# Patient Record
Sex: Male | Born: 1970 | Race: Black or African American | Hispanic: No | Marital: Single | Smoking: Current every day smoker
Health system: Southern US, Community
[De-identification: ages and names within clinical notes are randomized; demographics above are authoritative.]

## PROBLEM LIST (undated history)

## (undated) DIAGNOSIS — I10 Essential (primary) hypertension: Secondary | ICD-10-CM

## (undated) DIAGNOSIS — J449 Chronic obstructive pulmonary disease, unspecified: Secondary | ICD-10-CM

## (undated) DIAGNOSIS — J45909 Unspecified asthma, uncomplicated: Secondary | ICD-10-CM

---

## 2020-05-21 DIAGNOSIS — I10 Essential (primary) hypertension: Secondary | ICD-10-CM | POA: Diagnosis present

## 2020-05-21 DIAGNOSIS — Z720 Tobacco use: Secondary | ICD-10-CM | POA: Diagnosis present

## 2020-05-21 DIAGNOSIS — J439 Emphysema, unspecified: Secondary | ICD-10-CM | POA: Diagnosis present

## 2020-10-12 DIAGNOSIS — J302 Other seasonal allergic rhinitis: Secondary | ICD-10-CM | POA: Insufficient documentation

## 2020-12-15 ENCOUNTER — Encounter (HOSPITAL_COMMUNITY): Payer: Self-pay | Admitting: Emergency Medicine

## 2020-12-15 ENCOUNTER — Other Ambulatory Visit: Payer: Self-pay

## 2020-12-15 ENCOUNTER — Emergency Department (HOSPITAL_COMMUNITY)
Admission: EM | Admit: 2020-12-15 | Discharge: 2020-12-16 | Disposition: A | Discharge status: Home / Self Care | Payer: Self-pay | Attending: Emergency Medicine | Admitting: Emergency Medicine

## 2020-12-15 ENCOUNTER — Emergency Department (HOSPITAL_COMMUNITY): Payer: Self-pay

## 2020-12-15 DIAGNOSIS — I10 Essential (primary) hypertension: Secondary | ICD-10-CM | POA: Insufficient documentation

## 2020-12-15 DIAGNOSIS — Z79899 Other long term (current) drug therapy: Secondary | ICD-10-CM | POA: Insufficient documentation

## 2020-12-15 DIAGNOSIS — R000 Tachycardia, unspecified: Secondary | ICD-10-CM | POA: Insufficient documentation

## 2020-12-15 DIAGNOSIS — J45909 Unspecified asthma, uncomplicated: Secondary | ICD-10-CM | POA: Insufficient documentation

## 2020-12-15 DIAGNOSIS — Z20822 Contact with and (suspected) exposure to covid-19: Secondary | ICD-10-CM | POA: Insufficient documentation

## 2020-12-15 DIAGNOSIS — F17210 Nicotine dependence, cigarettes, uncomplicated: Secondary | ICD-10-CM | POA: Insufficient documentation

## 2020-12-15 DIAGNOSIS — J441 Chronic obstructive pulmonary disease with (acute) exacerbation: Secondary | ICD-10-CM | POA: Insufficient documentation

## 2020-12-15 HISTORY — DX: Chronic obstructive pulmonary disease, unspecified: J44.9

## 2020-12-15 HISTORY — DX: Unspecified asthma, uncomplicated: J45.909

## 2020-12-15 HISTORY — DX: Essential (primary) hypertension: I10

## 2020-12-15 LAB — CBC
HCT: 55.8 % — ABNORMAL HIGH (ref 39.0–52.0)
Hemoglobin: 18.4 g/dL — ABNORMAL HIGH (ref 13.0–17.0)
MCH: 29.1 pg (ref 26.0–34.0)
MCHC: 33 g/dL (ref 30.0–36.0)
MCV: 88.3 fL (ref 80.0–100.0)
Platelets: 290 10*3/uL (ref 150–400)
RBC: 6.32 MIL/uL — ABNORMAL HIGH (ref 4.22–5.81)
RDW: 13 % (ref 11.5–15.5)
WBC: 6 10*3/uL (ref 4.0–10.5)
nRBC: 0 % (ref 0.0–0.2)

## 2020-12-15 LAB — BASIC METABOLIC PANEL
Anion gap: 8 (ref 5–15)
BUN: 12 mg/dL (ref 6–20)
CO2: 27 mmol/L (ref 22–32)
Calcium: 9.5 mg/dL (ref 8.9–10.3)
Chloride: 98 mmol/L (ref 98–111)
Creatinine, Ser: 1.15 mg/dL (ref 0.61–1.24)
GFR, Estimated: >60 mL/min (ref >60)
Glucose, Bld: 113 mg/dL — ABNORMAL HIGH (ref 70–99)
Potassium: 4.9 mmol/L (ref 3.5–5.1)
Sodium: 133 mmol/L — ABNORMAL LOW (ref 135–145)

## 2020-12-15 MED ORDER — ALBUTEROL SULFATE HFA 108 (90 BASE) MCG/ACT IN AERS
2.0000 | INHALATION_SPRAY | RESPIRATORY_TRACT | Status: DC | PRN
  Administered 2020-12-16: 2 via RESPIRATORY_TRACT
  Filled 2020-12-15: qty 6.7

## 2020-12-15 NOTE — ED Triage Notes (Signed)
Pt here with c/o chest pain and shortness of breath since yesterday. Endorses hx of COPD, he has used both albuterol and Symbicort with no relief. Denies fever, n/v. No radiation of pain 'just tight'.

## 2020-12-15 NOTE — ED Provider Notes (Signed)
Emergency Medicine Provider Triage Evaluation Note  Steven [name] , a 50 y.o. male  was evaluated in triage.  Pt complains of shortness of breath and chest tightness.  Symptoms worsened yesterday.  He does have a history of COPD.  When he uses his albuterol, he does not wear significant improvement.  He has not been on prednisone for several months.  He recently moved to the area.  He denies fevers, chills, chest pain.  He denies sick contacts.  He is vaccinated for COVID and flu.  Review of Systems  Positive: Sob, chest tightness Negative: fever  Physical Exam  BP 134/90 (BP Location: Right Arm)   Pulse (!) 117   Temp 97.9 F (36.6 C)   Resp 20   Ht 5\' 10"  (1.778 m)   Wt 75.8 kg   SpO2 97%   BMI 23.96 kg/m  Gen:   Awake, no distress   Resp:  Expiratory wheezing.  Speaking in short sentences.  Sats stable on room air.  No respiratory distress MSK:   Moves extremities without difficulty Other:  Tachycardic  Medical Decision Making  Medically screening exam initiated at 3:48 PM.  Appropriate orders placed.  Steven [name] was informed that the remainder of the evaluation will be completed by another provider, this initial triage assessment does not replace that evaluation, and the importance of remaining in the ED until their evaluation is complete.  Labs, covid, and cxr ordered   Joanna Puff, PA-C 12/15/20 1549    12/17/20, MD 12/17/20 1510

## 2020-12-16 LAB — RESP PANEL BY RT-PCR (FLU A&B, COVID) ARPGX2
Influenza A by PCR: NEGATIVE
Influenza B by PCR: NEGATIVE
SARS Coronavirus 2 by RT PCR: NEGATIVE

## 2020-12-16 MED ORDER — PREDNISONE 10 MG (21) PO TBPK: ORAL_TABLET | Freq: Every day | ORAL | 0 refills | Status: DC

## 2020-12-16 MED ORDER — METHYLPREDNISOLONE SODIUM SUCC 125 MG IJ SOLR
125.0000 mg | Freq: Once | INTRAMUSCULAR | Status: AC
  Administered 2020-12-16: 125 mg via INTRAVENOUS
  Filled 2020-12-16: qty 2

## 2020-12-16 MED ORDER — IPRATROPIUM-ALBUTEROL 0.5-2.5 (3) MG/3ML IN SOLN
3.0000 mL | Freq: Once | RESPIRATORY_TRACT | Status: AC
  Administered 2020-12-16: 3 mL via RESPIRATORY_TRACT
  Filled 2020-12-16: qty 3

## 2020-12-16 MED ORDER — ALBUTEROL SULFATE (2.5 MG/3ML) 0.083% IN NEBU: 2.5000 mg | INHALATION_SOLUTION | RESPIRATORY_TRACT | 12 refills | Status: DC | PRN

## 2020-12-16 NOTE — ED Notes (Signed)
Pt tolerated ambulation well.

## 2020-12-16 NOTE — Discharge Instructions (Addendum)
Thank you for allowing me to care for you today in the Emergency Department.   Your work-up today was consistent with a COPD exacerbation.  This was treated with breathing treatments and steroids.  Take prednisone as prescribed starting tomorrow since you were given a dose of steroids in the emergency department today.  I gave you a paper prescription of this because you should receive a call from one of our social workers from the hospital to try and get you established with [facility].  When they call you, you can see if you can get this prescription filled at the [facility] pharmacy.  Use 2 puffs of your albuterol inhaler or a breathing treatment once every 4 hours as needed for wheezing or shortness of breath.  Continue your home Symbicort as prescribed.  Return to the emergency department if you develop respiratory distress, if you pass out, if your fingers or lips turn blue, if you develop severe chest pain that is constant, or other new, concerning symptoms.

## 2020-12-16 NOTE — ED Provider Notes (Signed)
[facility] EMERGENCY DEPARTMENT Provider Note   CSN: [id number] Arrival date & time: 12/15/20  1514     History Chief Complaint  Patient presents with  . Chest Pain  . Shortness of Breath    Steven [name] is a 50 y.o. male with a history of COPD, HTN, and asthma who presents to the emergency department with a chief complaint of shortness of breath.  The patient endorses constant, worsening shortness of breath, onset yesterday.  However, his symptoms significantly worsened today, which prompted his visit to the ER.  He reports an associated intermittently productive cough and chest tightness.  He states that his symptoms feel similar to previous COPD exacerbations.  He has been using his home nebulizer, albuterol inhaler, and Symbicort with no relief.  Last albuterol nebulizer treatment was yesterday.  He reports that he has been trying to quit smoking and is using nicotine gum and patches, but will occasionally still have a cigarette.  He otherwise denies chest pain, fever, chills, abdominal pain, diaphoresis, dizziness, lightheadedness, leg swelling, palpitations, headache, vomiting, diarrhea.  No other treatment prior to arrival.  The history is provided by the patient and medical records. No language interpreter was used.       Past Medical History:  Diagnosis Date  . Asthma   . COPD (chronic obstructive pulmonary disease) (HCC)   . Hypertension     There are no problems to display for this patient.   History reviewed. No pertinent surgical history.     No family history on file.  Social History   Tobacco Use  . Smoking status: Current Every Day Smoker    Packs/day: 0.20    Types: Cigarettes  . Smokeless tobacco: Never Used  Substance Use Topics  . Alcohol use: Yes  . Drug use: Not Currently    Home Medications Prior to Admission medications   Medication Sig Start Date End Date Taking? Authorizing Provider  albuterol (VENTOLIN HFA) 108  (90 Base) MCG/ACT inhaler Inhale 1-2 puffs into the lungs every 6 (six) hours as needed for wheezing or shortness of breath.   Yes [provider]  budesonide-formoterol (SYMBICORT) 160-4.5 MCG/ACT inhaler Inhale 2 puffs into the lungs 2 (two) times daily.   Yes [provider]  cetirizine (ZYRTEC) 10 MG tablet Take 10 mg by mouth daily.   Yes [provider]  lisinopril (ZESTRIL) 10 MG tablet Take 10 mg by mouth daily.   Yes [provider]  predniSONE (STERAPRED UNI-PAK 21 TAB) 10 MG (21) TBPK tablet Take by mouth daily. Take 6 tabs by mouth daily  for 2 days, then 5 tabs for 2 days, then 4 tabs for 2 days, then 3 tabs for 2 days, 2 tabs for 2 days, then 1 tab by mouth daily for 2 days 12/16/20  Yes Bode Pieper A, PA-C  albuterol (PROVENTIL) (2.5 MG/3ML) 0.083% nebulizer solution Take 3 mLs (2.5 mg total) by nebulization every 4 (four) hours as needed for wheezing or shortness of breath. 12/16/20   Sihaam Chrobak A, PA-C    Allergies    Patient has no known allergies.  Review of Systems   Review of Systems  Constitutional: Negative for appetite change, chills and fever.  HENT: Negative for congestion and sore throat.   Eyes: Negative for visual disturbance.  Respiratory: Positive for cough, chest tightness and shortness of breath. Negative for wheezing.   Cardiovascular: Negative for chest pain, palpitations and leg swelling.  Gastrointestinal: Negative for abdominal pain, constipation,  diarrhea, nausea and vomiting.  Genitourinary: Negative for dysuria.  Musculoskeletal: Negative for arthralgias, back pain, myalgias, neck pain and neck stiffness.  Skin: Negative for rash and wound.  Allergic/Immunologic: Negative for immunocompromised state.  Neurological: Negative for dizziness, seizures, syncope, weakness, numbness and headaches.  Psychiatric/Behavioral: Negative for confusion.    Physical Exam Updated Vital Signs BP 122/82   Pulse 99   Temp (!)  97.5 F (36.4 C) (Oral)   Resp 15   Ht 5\' 10"  (1.778 m)   Wt 75.8 kg   SpO2 94%   BMI 23.96 kg/m   Physical Exam Vitals and nursing note reviewed.  Constitutional:      Appearance: He is well-developed. He is not ill-appearing, toxic-appearing or diaphoretic.  HENT:     Head: Normocephalic.  Eyes:     Conjunctiva/sclera: Conjunctivae normal.  Cardiovascular:     Rate and Rhythm: Regular rhythm. Tachycardia present.     Heart sounds: No murmur heard.   Pulmonary:     Breath sounds: No stridor. Wheezing present. No rhonchi or rales.     Comments: He is tachypneic.  He has conversational dyspnea and takes a breath every 2-3 words.  Lung sounds are diminished throughout, but there are faint end expiratory wheezes with expiration.  No retractions or accessory muscle use. Chest:     Chest wall: No tenderness.  Abdominal:     General: There is no distension.     Palpations: Abdomen is soft.     Tenderness: There is no abdominal tenderness.  Musculoskeletal:     Cervical back: Neck supple.     Right lower leg: No edema.     Left lower leg: No edema.  Skin:    General: Skin is warm and dry.     Coloration: Skin is not jaundiced or pale.     Findings: No bruising, erythema, lesion or rash.  Neurological:     Mental Status: He is alert.  Psychiatric:        Behavior: Behavior normal.     ED Results / Procedures / Treatments   Labs (all labs ordered are listed, but only abnormal results are displayed) Labs Reviewed  BASIC METABOLIC PANEL - Abnormal; Notable for the following components:      Result Value   Sodium 133 (*)    Glucose, Bld 113 (*)    All other components within normal limits  CBC - Abnormal; Notable for the following components:   RBC 6.32 (*)    Hemoglobin 18.4 (*)    HCT 55.8 (*)    All other components within normal limits  RESP PANEL BY RT-PCR (FLU A&B, COVID) [id number]    EKG None  Radiology DG Chest 2 View  Result Date: 12/15/2020 CLINICAL  DATA:  Chest pain and shortness of breath. EXAM: CHEST - 2 VIEW COMPARISON:  None. FINDINGS: Lungs are hyperinflated. Marked severity emphysematous lung disease is seen with bullous changes seen throughout the bilateral upper lobes. There is no evidence of acute infiltrate. Very mild blunting of the bilateral costophrenic angles is seen. No pneumothorax is identified. The heart size and mediastinal contours are within normal limits. The visualized skeletal structures are unremarkable. IMPRESSION: 1. COPD with small bilateral pleural effusions versus pleural thickening. Electronically Signed   By: 12/17/2020 M.D.   On: 12/15/2020 16:27    Procedures Procedures   Medications Ordered in ED Medications  albuterol (VENTOLIN HFA) 108 (90 Base) MCG/ACT inhaler 2 puff (2 puffs Inhalation Given 12/16/20 0131)  methylPREDNISolone sodium succinate (SOLU-MEDROL) 125 mg/2 mL injection 125 mg (125 mg Intravenous Given 12/16/20 0132)  ipratropium-albuterol (DUONEB) 0.5-2.5 (3) MG/3ML nebulizer solution 3 mL (3 mLs Nebulization Given 12/16/20 0132)  ipratropium-albuterol (DUONEB) 0.5-2.5 (3) MG/3ML nebulizer solution 3 mL (3 mLs Nebulization Given 12/16/20 0254)    ED Course  I have reviewed the triage vital signs and the nursing notes.  Pertinent labs & imaging results that were available during my care of the patient were reviewed by me and considered in my medical decision making (see chart for details).  Clinical Course as of 12/16/20 0810  Wed Dec 16, 2020  0626 Patient recheck.  Oxygen saturation is around 93 to 94%.  He is currently receiving another DuoNeb.  Air movement has minimally increased from previous.  Wheezing is more pronounced with expiration in all lung fields.  No rhonchi or rales. [MM]    Clinical Course User Index [MM] Wilbern Pennypacker, Coral Else, PA-C   MDM Rules/Calculators/A&P                          50 year old male with a history of COPD, HTN, and asthma who presents the emergency  department with shortness of breath, intermittent productive cough, and chest tightness, onset yesterday.  He has tachypneic and tachycardic on my evaluation.  Oxygen saturations in the low 90s.  He is mildly hypertensive.  Afebrile.  On exam, he has tachypnea with conversational dyspnea and diminished lung sounds throughout with faint end expiratory wheezes.  Will give albuterol nebulizer treatment, Solu-Medrol, and reassess.  Labs and imaging of been reviewed and independently interpreted by me. CBC appears hemoconcentrated.  No leukocytosis.  No metabolic derangements.  Chest x-ray with hyperinflation of the lungs and severe emphysematous lung disease and with bullous changes throughout the bilateral upper lobes.  No acute infiltrate.  This is most suspicious for COPD with small bilateral pleural effusions versus pleural thickening.  I have a low suspicion for ACS, PE, aortic dissection, esophageal rupture, pancreatitis, cholecystitis, tension pneumothorax, acute bacterial pneumonia.  On reevaluation, patient's breathing is significantly improved after treatment.  He was ambulated through the department and did not have any hypoxia.  Wheezing has resolved.  Will discharge home with albuterol nebulizer and inhaler and prednisone Dosepak.  Will place transition of care consult since patient is not currently established with a PCP since relocating to the area.  All questions answered.  The patient is hemodynamically stable in no acute distress.  Safe for discharge home with outpatient follow-up as discussed.  Final Clinical Impression(s) / ED Diagnoses Final diagnoses:  COPD with acute exacerbation (HCC)    Rx / DC Orders ED Discharge Orders         Ordered    predniSONE (STERAPRED UNI-PAK 21 TAB) 10 MG (21) TBPK tablet  Daily        12/16/20 0623    albuterol (PROVENTIL) (2.5 MG/3ML) 0.083% nebulizer solution  Every 4 hours PRN,   Status:  Discontinued        12/16/20 0623    albuterol  (PROVENTIL) (2.5 MG/3ML) 0.083% nebulizer solution  Every 4 hours PRN        12/16/20 0625           Frederik Pear A, PA-C 12/16/20 0810    Mesner, Barbara Cower, MD 12/18/20 1450

## 2020-12-18 ENCOUNTER — Telehealth: Payer: Self-pay | Admitting: Surgery

## 2020-12-18 DIAGNOSIS — J441 Chronic obstructive pulmonary disease with (acute) exacerbation: Secondary | ICD-10-CM

## 2020-12-18 NOTE — Telephone Encounter (Signed)
ED RNCM received consult  Concerning patient needing COPD follow up. Referrals sent to [facility] they will contact patient to arrange OP follow up.  Attempted to contact patient with this update will make second attempt later.

## 2021-03-02 ENCOUNTER — Encounter (HOSPITAL_COMMUNITY): Payer: Self-pay | Admitting: Emergency Medicine

## 2021-03-02 ENCOUNTER — Inpatient Hospital Stay (HOSPITAL_COMMUNITY)
Admission: EM | Admit: 2021-03-02 | Discharge: 2021-03-03 | DRG: 192 | Disposition: A | Discharge status: Home / Self Care | Payer: Self-pay | Attending: Student in an Organized Health Care Education/Training Program | Admitting: Student in an Organized Health Care Education/Training Program

## 2021-03-02 ENCOUNTER — Other Ambulatory Visit: Payer: Self-pay

## 2021-03-02 ENCOUNTER — Emergency Department (HOSPITAL_COMMUNITY): Payer: Self-pay

## 2021-03-02 DIAGNOSIS — Z20822 Contact with and (suspected) exposure to covid-19: Secondary | ICD-10-CM | POA: Diagnosis present

## 2021-03-02 DIAGNOSIS — J439 Emphysema, unspecified: Secondary | ICD-10-CM | POA: Diagnosis present

## 2021-03-02 DIAGNOSIS — Z720 Tobacco use: Secondary | ICD-10-CM | POA: Diagnosis present

## 2021-03-02 DIAGNOSIS — F17210 Nicotine dependence, cigarettes, uncomplicated: Secondary | ICD-10-CM | POA: Diagnosis present

## 2021-03-02 DIAGNOSIS — J441 Chronic obstructive pulmonary disease with (acute) exacerbation: Principal | ICD-10-CM | POA: Diagnosis present

## 2021-03-02 DIAGNOSIS — Z79899 Other long term (current) drug therapy: Secondary | ICD-10-CM

## 2021-03-02 DIAGNOSIS — I10 Essential (primary) hypertension: Secondary | ICD-10-CM | POA: Diagnosis present

## 2021-03-02 DIAGNOSIS — R0902 Hypoxemia: Secondary | ICD-10-CM | POA: Diagnosis present

## 2021-03-02 DIAGNOSIS — Z833 Family history of diabetes mellitus: Secondary | ICD-10-CM

## 2021-03-02 DIAGNOSIS — R0603 Acute respiratory distress: Secondary | ICD-10-CM | POA: Diagnosis present

## 2021-03-02 DIAGNOSIS — Z7951 Long term (current) use of inhaled steroids: Secondary | ICD-10-CM

## 2021-03-02 LAB — CBC WITH DIFFERENTIAL/PLATELET
Abs Immature Granulocytes: 0.02 10*3/uL (ref 0.00–0.07)
Basophils Absolute: 0.1 10*3/uL (ref 0.0–0.1)
Basophils Relative: 1 %
Eosinophils Absolute: 0.3 10*3/uL (ref 0.0–0.5)
Eosinophils Relative: 4 %
HCT: 50.9 % (ref 39.0–52.0)
Hemoglobin: 16.4 g/dL (ref 13.0–17.0)
Immature Granulocytes: 0 %
Lymphocytes Relative: 31 %
Lymphs Abs: 2.1 10*3/uL (ref 0.7–4.0)
MCH: 28.4 pg (ref 26.0–34.0)
MCHC: 32.2 g/dL (ref 30.0–36.0)
MCV: 88.2 fL (ref 80.0–100.0)
Monocytes Absolute: 0.9 10*3/uL (ref 0.1–1.0)
Monocytes Relative: 14 %
Neutro Abs: 3.4 10*3/uL (ref 1.7–7.7)
Neutrophils Relative %: 50 %
Platelets: 249 10*3/uL (ref 150–400)
RBC: 5.77 MIL/uL (ref 4.22–5.81)
RDW: 16.4 % — ABNORMAL HIGH (ref 11.5–15.5)
WBC: 6.8 10*3/uL (ref 4.0–10.5)
nRBC: 0 % (ref 0.0–0.2)

## 2021-03-02 LAB — BASIC METABOLIC PANEL
Anion gap: 10 (ref 5–15)
BUN: 7 mg/dL (ref 6–20)
CO2: 24 mmol/L (ref 22–32)
Calcium: 8.8 mg/dL — ABNORMAL LOW (ref 8.9–10.3)
Chloride: 102 mmol/L (ref 98–111)
Creatinine, Ser: 0.94 mg/dL (ref 0.61–1.24)
GFR, Estimated: >60 mL/min (ref >60)
Glucose, Bld: 125 mg/dL — ABNORMAL HIGH (ref 70–99)
Potassium: 5.1 mmol/L (ref 3.5–5.1)
Sodium: 136 mmol/L (ref 135–145)

## 2021-03-02 LAB — RESPIRATORY PANEL BY PCR

## 2021-03-02 LAB — SARS CORONAVIRUS 2 (TAT 6-24 HRS): SARS Coronavirus 2: NEGATIVE

## 2021-03-02 MED ORDER — IPRATROPIUM-ALBUTEROL 0.5-2.5 (3) MG/3ML IN SOLN
3.0000 mL | Freq: Four times a day (QID) | RESPIRATORY_TRACT | Status: DC
  Administered 2021-03-02 – 2021-03-03 (×3): 3 mL via RESPIRATORY_TRACT
  Filled 2021-03-02 (×4): qty 3

## 2021-03-02 MED ORDER — FLUTICASONE FUROATE-VILANTEROL 200-25 MCG/INH IN AEPB
1.0000 | INHALATION_SPRAY | Freq: Every day | RESPIRATORY_TRACT | Status: DC
  Administered 2021-03-02 – 2021-03-03 (×2): 1 via RESPIRATORY_TRACT
  Filled 2021-03-02: qty 28

## 2021-03-02 MED ORDER — METHYLPREDNISOLONE SODIUM SUCC 40 MG IJ SOLR
40.0000 mg | INTRAMUSCULAR | Status: AC
  Administered 2021-03-02: 40 mg via INTRAVENOUS
  Filled 2021-03-02: qty 1

## 2021-03-02 MED ORDER — PREDNISONE 20 MG PO TABS
40.0000 mg | ORAL_TABLET | Freq: Every day | ORAL | Status: DC
  Administered 2021-03-03: 40 mg via ORAL
  Filled 2021-03-02: qty 2

## 2021-03-02 MED ORDER — AZITHROMYCIN 250 MG PO TABS: 250.0000 mg | ORAL_TABLET | Freq: Every day | ORAL | Status: DC

## 2021-03-02 MED ORDER — ALBUTEROL SULFATE (2.5 MG/3ML) 0.083% IN NEBU
2.5000 mg | INHALATION_SOLUTION | Freq: Four times a day (QID) | RESPIRATORY_TRACT | Status: DC | PRN
  Administered 2021-03-02: 2.5 mg via RESPIRATORY_TRACT

## 2021-03-02 MED ORDER — NICOTINE 14 MG/24HR TD PT24
14.0000 mg | MEDICATED_PATCH | TRANSDERMAL | Status: DC
  Administered 2021-03-02: 14 mg via TRANSDERMAL
  Filled 2021-03-02: qty 1

## 2021-03-02 MED ORDER — ONDANSETRON HCL 4 MG PO TABS: 4.0000 mg | ORAL_TABLET | Freq: Four times a day (QID) | ORAL | Status: DC | PRN

## 2021-03-02 MED ORDER — SODIUM CHLORIDE 0.9% FLUSH
3.0000 mL | Freq: Two times a day (BID) | INTRAVENOUS | Status: DC
  Administered 2021-03-02: 3 mL via INTRAVENOUS

## 2021-03-02 MED ORDER — ACETAMINOPHEN 325 MG PO TABS: 650.0000 mg | ORAL_TABLET | Freq: Four times a day (QID) | ORAL | Status: DC | PRN

## 2021-03-02 MED ORDER — SODIUM CHLORIDE 0.9 % IV SOLN
500.0000 mg | INTRAVENOUS | Status: AC
  Administered 2021-03-02: 500 mg via INTRAVENOUS
  Filled 2021-03-02: qty 500

## 2021-03-02 MED ORDER — ONDANSETRON HCL 4 MG/2ML IJ SOLN: 4.0000 mg | Freq: Four times a day (QID) | INTRAMUSCULAR | Status: DC | PRN

## 2021-03-02 MED ORDER — MAGNESIUM SULFATE IN D5W 1-5 GM/100ML-% IV SOLN
1.0000 g | Freq: Once | INTRAVENOUS | Status: AC
  Administered 2021-03-02: 1 g via INTRAVENOUS
  Filled 2021-03-02: qty 100

## 2021-03-02 MED ORDER — ACETAMINOPHEN 650 MG RE SUPP: 650.0000 mg | Freq: Four times a day (QID) | RECTAL | Status: DC | PRN

## 2021-03-02 MED ORDER — SENNOSIDES-DOCUSATE SODIUM 8.6-50 MG PO TABS: 1.0000 | ORAL_TABLET | Freq: Every evening | ORAL | Status: DC | PRN

## 2021-03-02 MED ORDER — ENOXAPARIN SODIUM 40 MG/0.4ML IJ SOSY
40.0000 mg | PREFILLED_SYRINGE | INTRAMUSCULAR | Status: DC
  Administered 2021-03-02: 40 mg via SUBCUTANEOUS
  Filled 2021-03-02: qty 0.4

## 2021-03-02 MED ORDER — LISINOPRIL 10 MG PO TABS
10.0000 mg | ORAL_TABLET | Freq: Every day | ORAL | Status: DC
  Administered 2021-03-03: 10 mg via ORAL
  Filled 2021-03-02: qty 1

## 2021-03-02 NOTE — Hospital Course (Addendum)
Steven [name] states he was on BiPAP for 3 hours total. He felt better wearing  it. Prior to coming in, he endorses worsening SOB, in addition to orthopnea, that significantly worsened on the 1st. He feels his continued tobacco use triggered his current exacerbation. He endorses wheezing but denies increased sputum production.   He notes a hx of chest tube placement when he was young. His lung collapsed spontaneously. It has not re-occurred.   He is willing to try Chantix.    We discussed dicharge potentially with Prednisone, Azithro, Spiriva and Chantix.  We will set him up in our clinic in 2 weeks.

## 2021-03-02 NOTE — ED Provider Notes (Signed)
She is [facility] EMERGENCY DEPARTMENT Provider Note   CSN: [id number] Arrival date & time: 03/02/21  1244     History Chief Complaint  Patient presents with   Shortness of Breath    Steven [name] is a 50 y.o. male.  HPI  50 year old male with past medical history of HTN, COPD presents emergency department with hypoxia and shortness of breath.  Admitted secondary to acuity.  Report from EMS is that the patient has been having worsening shortness of breath for the past 2 days.  On their arrival he was noted to be tachypneic and hypoxic.  He got 2 DuoNeb's and Solu-Medrol in route.  On arrival patient is tripoding, significant diminished breath sounds bilaterally with increased respiratory work.  Past Medical History:  Diagnosis Date   Asthma    COPD (chronic obstructive pulmonary disease) (HCC)    Hypertension     There are no problems to display for this patient.   History reviewed. No pertinent surgical history.     No family history on file.  Social History   Tobacco Use   Smoking status: Every Day    Packs/day: 0.20    Types: Cigarettes   Smokeless tobacco: Never  Substance Use Topics   Alcohol use: Yes   Drug use: Not Currently    Home Medications Prior to Admission medications   Medication Sig Start Date End Date Taking? Authorizing Provider  albuterol (PROVENTIL) (2.5 MG/3ML) 0.083% nebulizer solution Take 3 mLs (2.5 mg total) by nebulization every 4 (four) hours as needed for wheezing or shortness of breath. 12/16/20   McDonald, Mia A, PA-C  albuterol (VENTOLIN HFA) 108 (90 Base) MCG/ACT inhaler Inhale 1-2 puffs into the lungs every 6 (six) hours as needed for wheezing or shortness of breath.    [provider]  budesonide-formoterol (SYMBICORT) 160-4.5 MCG/ACT inhaler Inhale 2 puffs into the lungs 2 (two) times daily.    [provider]  cetirizine (ZYRTEC) 10 MG tablet Take 10 mg by mouth daily.    [provider]  lisinopril (ZESTRIL) 10 MG tablet Take 10 mg by mouth daily.    [provider]  predniSONE (STERAPRED UNI-PAK 21 TAB) 10 MG (21) TBPK tablet Take by mouth daily. Take 6 tabs by mouth daily  for 2 days, then 5 tabs for 2 days, then 4 tabs for 2 days, then 3 tabs for 2 days, 2 tabs for 2 days, then 1 tab by mouth daily for 2 days 12/16/20   McDonald, Mia A, PA-C    Allergies    Patient has no known allergies.  Review of Systems   Review of Systems  Unable to perform ROS: Acuity of condition   Physical Exam Updated Vital Signs BP (!) 146/97   Pulse 100   Temp (!) 96.6 F (35.9 C) (Axillary)   Resp (!) 37   Ht 5\' 10"  (1.778 m)   Wt 76.2 kg   SpO2 99%   BMI 24.11 kg/m   Physical Exam Vitals and nursing note reviewed.  Constitutional:      Appearance: Normal appearance.  HENT:     Head: Normocephalic.     Mouth/Throat:     Mouth: Mucous membranes are moist.  Cardiovascular:     Rate and Rhythm: Normal rate.  Pulmonary:     Effort: Tachypnea, accessory muscle usage and respiratory distress present.     Breath sounds: Decreased breath sounds and wheezing present.  Chest:     Chest  wall: No crepitus.  Abdominal:     Palpations: Abdomen is soft.     Tenderness: There is no abdominal tenderness.  Musculoskeletal:     Right lower leg: No edema.     Left lower leg: No edema.  Skin:    General: Skin is warm.  Neurological:     Mental Status: He is alert and oriented to person, place, and time. Mental status is at baseline.  Psychiatric:        Mood and Affect: Mood normal.    ED Results / Procedures / Treatments   Labs (all labs ordered are listed, but only abnormal results are displayed) Labs Reviewed  CBC WITH DIFFERENTIAL/PLATELET - Abnormal; Notable for the following components:      Result Value   RDW 16.4 (*)    All other components within normal limits  BASIC METABOLIC PANEL - Abnormal; Notable for the following components:   Glucose, Bld 125 (*)     Calcium 8.8 (*)    All other components within normal limits  SARS CORONAVIRUS 2 (TAT 6-24 HRS)    EKG EKG Interpretation  Date/Time:  Tuesday March 02 2021 12:56:03 EDT Ventricular Rate:  99 PR Interval:  144 QRS Duration: 100 QT Interval:  350 QTC Calculation: 450 R Axis:   29 Text Interpretation: Sinus rhythm Biatrial enlargement Abnormal R-wave progression, late transition NSR, similar to previous Confirmed by Coralee Pesa [phone]) on 03/02/2021 2:10:32 PM  Radiology DG Chest Port 1 View  Result Date: 03/02/2021 CLINICAL DATA:  Worsening shortness of breath over the last few days. EXAM: PORTABLE CHEST 1 VIEW COMPARISON:  12/15/2020 FINDINGS: Advanced bilateral bullous emphysema. Crowding of the lung markings at the bases. Question the possibility of hazy pneumonia in the lower lungs, in comparison to the previous exam. No evidence of pneumothorax. No pleural effusion. IMPRESSION: Severe chronic emphysema with crowding of lung markings at the lung bases. Basilar markings appear more prominent than they did in May of this year, raising the possibility of hazy pneumonia. No dense consolidation. Electronically Signed   By: Paulina Fusi M.D.   On: 03/02/2021 13:37    Procedures Procedures   Medications Ordered in ED Medications  magnesium sulfate IVPB 1 g 100 mL (1 g Intravenous New Bag/Given 03/02/21 1344)    ED Course  I have reviewed the triage vital signs and the nursing notes.  Pertinent labs & imaging results that were available during my care of the patient were reviewed by me and considered in my medical decision making (see chart for details).    MDM Rules/Calculators/A&P                           50 year old male presents the emergency department with shortness of breath.  He is dyspneic on arrival, leaning forward and tripoding, increased work of breathing, significantly diminished breath sounds bilaterally.  Transition to BiPAP with great improvement.  Chest x-ray  looks consistent with severe COPD findings, blood work is otherwise reassuring.  He has responded well to the BiPAP and looks comfortable, vitals have normalized.  Plan to wean BiPAP and admit for further evaluation and treatment.  COVID is pending.  Patients evaluation and results requires admission for further treatment and care. Patient agrees with admission plan, offers no new complaints and is stable/unchanged at time of admit.  Final Clinical Impression(s) / ED Diagnoses Final diagnoses:  None    Rx / DC Orders ED Discharge Orders  None        Rozelle Logan, DO 03/02/21 1517

## 2021-03-02 NOTE — ED Notes (Signed)
Horton, MD at bedside. RT notified to place pt on bipap.

## 2021-03-02 NOTE — H&P (Addendum)
Date: 03/02/2021               Patient Name:  Steven [name] MRN: [id number]  DOB: May 30, 1971 Age / Sex: 50 y.o., male   PCP: Pcp, No              Medical Service: Internal Medicine Teaching Service              Attending Physician: Dr. Oswaldo Done, Marquita Palms, *    First Contact: Porfirio Mylar, MS 4 Pager: [phone]  Second Contact: Dr. Mcarthur Rossetti Pager: [phone]            After Hours (After 5p/  First Contact Pager: [phone]  weekends / holidays): Second Contact Pager: [phone]   Chief Complaint: Shortness of breath  History of Present Illness:   Steven [name] is a 50 year old man with pmhx of asthma, COPD (with no previous PFTs) and hypertension here with a 1-day history of shortness of breath on exertion.   Patient notes that he has been feeling increasingly short of breath when exerting him self in the last couple of days. He denies dyspnea at rest and no chest pain. Also he notes shortness of breath when lying flat. He denies cough, sputum production, fever, chills or significant weight changes. He has not had any recent URIs. Per chart review, pt has been vaccinated for COVID and flu.  No recent changes to medications, steroids or hx of travel. He works in Production [employer], theatre/television/film and notes that working in the heat has made his breathing worse. Otherwise no other recent stressors. Pt notes that he has been using his home nebulizer and his rescue inhaler more frequently than normal with out much relief. He was being treated with BiPAP in the ED which he notes made him feel better.   Meds:  No current facility-administered medications on file prior to encounter.   Current Outpatient Medications on File Prior to Encounter  Medication Sig Dispense Refill   albuterol (PROVENTIL) (2.5 MG/3ML) 0.083% nebulizer solution Take 3 mLs (2.5 mg total) by nebulization every 4 (four) hours as needed for wheezing or shortness of breath. 75 mL 12   albuterol (VENTOLIN HFA) 108 (90 Base) MCG/ACT inhaler Inhale 1-2 puffs  into the lungs every 6 (six) hours as needed for wheezing or shortness of breath.     budesonide-formoterol (SYMBICORT) 160-4.5 MCG/ACT inhaler Inhale 2 puffs into the lungs 2 (two) times daily.     cetirizine (ZYRTEC) 10 MG tablet Take 10 mg by mouth daily.     lisinopril (ZESTRIL) 10 MG tablet Take 10 mg by mouth daily.     predniSONE (STERAPRED UNI-PAK 21 TAB) 10 MG (21) TBPK tablet Take by mouth daily. Take 6 tabs by mouth daily  for 2 days, then 5 tabs for 2 days, then 4 tabs for 2 days, then 3 tabs for 2 days, 2 tabs for 2 days, then 1 tab by mouth daily for 2 days 42 tablet 0   Allergies: Allergies as of 03/02/2021   (No Known Allergies)   Past Medical History:  Diagnosis Date   Asthma    COPD (chronic obstructive pulmonary disease) (HCC)    Hypertension     Family History: Has history of diabetes. Hx of cancer in his grandparent.   Social History: Steven [name] works in [place] and lives in [name] with his girlfriend. He does not have any pets at home. He reports a long history of smoking. Can not remember when he first started smoking. However he notes that  he smoke about 1 pack per day. No use of e-cigarettes or chewed tobacco. He drinks alcohol about 3 times a week. Unable to quantify how much he drink in on sitting. He reports no other drug use.   Review of Systems: A complete ROS was negative except as per HPI.   Physical Exam: Blood pressure (!) 143/86, pulse 79, temperature (!) 96.6 F (35.9 C), temperature source Axillary, resp. rate (!) 21, height 5\' 10"  (1.778 m), weight 76.2 kg, SpO2 98 %.  General: Well-appering man in no acute distress HEENT: No sclera icterus. Wearing BiPAP mask.  CV: Difficult to hear due to noise from BiPAP machine however S1S2 with no murmurs. Lungs: Fine crackles at base of bilateral lungs. No wheezing.  Abdomen: Non-distended, soft abdomen with out tenderness to light and deep palpation of all four quadrants.  Extremities: Bilateral  legs without swelling or erythema.  Skin: No rashes noted on visible skin.  Neuro: alert and oriented.  Psych: Appropriate affect.   CBC Latest Ref Rng & Units 03/02/2021 12/15/2020  WBC 4.0 - 10.5 K/uL 6.8 6.0  Hemoglobin 13.0 - 17.0 g/dL 12/17/2020 18.4(H)  Hematocrit 39.0 - 52.0 % 50.9 55.8(H)  Platelets 150 - 400 K/uL 249 290   CMP Latest Ref Rng & Units 03/02/2021 12/15/2020  Glucose 70 - 99 mg/dL 12/17/2020) 272(Z)  BUN 6 - 20 mg/dL 7 12  Creatinine 366(Y - 1.24 mg/dL 4.03 4.74  Sodium 2.59 - 145 mmol/L 136 133(L)  Potassium 3.5 - 5.1 mmol/L 5.1 4.9  Chloride 98 - 111 mmol/L 102 98  CO2 22 - 32 mmol/L 24 27  Calcium 8.9 - 10.3 mg/dL 563) 9.5     EKG: personally reviewed my interpretation is sinus rhythm with peaked T waves.   CXR: personally reviewed my interpretation is hazy opacities in bilateral lungs. No cardiomegaly and sharp costophrenic angles. Mild hyperinflation of lungs.   Assessment & Plan by Problem: Active Problems:   COPD exacerbation [facility])  [name] is a 50 year old male with pmhx of asthma, COPD (3-4 exacerbation in the last year), hypertension and tobacco use here with 1 day history of worsening dyspnea on exertion concerning for COPD exacerbation v pneumonia.   Dyspnea on excerption 2/2 COPD exacerbation: Pt presenting with 1-day history of DOE which has not improved with use on his home nebulizer or rescue inhaler and improves on BiPAP. No other symptoms like chest pain, cough, fever, or chills. On physical exam, afebrile, tachycardic up to 108 bpm and tachypnea. No wheezing or use of accessory muscles when breathing. CV exam unremarkable and no unilateral leg swelling. On labs, no leukocytosis. CXR showed hazy opacities bilaterally and no pneumothorax or pleural effusion. We suspect presentation is likely from a COPD exacerbation.  -start IV azithromycin 500 mg and transition to PO tomorrow  -Start methylprednisolone 40 IV and transition to prednisone taper  tomorrow  -cont albuterol, Breo and Duonebs -wean BiPAP as tolerated  -f/u respiratory panel -outpatient PFT -outpatient pulmonology follow up  HTN: Pt reports history of hypertension on home lisinopril-HCTZ 12.5 mg daily. SBP ranging from 132-168 mmHg. -start lisinopril 10 mg daily   Tobacco use: Pt smokes up to 1 pack per day.  -Start nicotine replacement patch   Diet: NPO until off BiPAP VTE: enoxaparin 40 mg daily  Code: Full  Dispo: Admit patient to Inpatient with expected length of stay greater than 2 midnights.  Signed:   Attestation for Student Documentation:  I personally was present and performed  or re-performed the history, physical exam and medical decision-making activities of this service and have verified that the service and findings are accurately documented in the student's note.  Eliezer Bottom, MD IMTS PGY-3 03/02/2021, 5:54 PM

## 2021-03-02 NOTE — ED Notes (Signed)
Pt appears to be asleep. NAD noted. 

## 2021-03-02 NOTE — ED Triage Notes (Signed)
Pt bib [facility] from home with increased shob x2days. Pt has tried home neb treatments with no results. Denies pain. Pt hasn't taken BP meds in 2 weeks due to the medication making him feel worse. EMS vitals: 210/110 BP, 26 R,102 P Pt given 2 duonebs and 125 solumedrol with EMS.

## 2021-03-02 NOTE — ED Notes (Signed)
RT at bedside.

## 2021-03-02 NOTE — ED Notes (Signed)
Assumed care at this time, chart reviewed

## 2021-03-02 NOTE — ED Notes (Signed)
Introduced myself to pt. Given ice and sprite. BSC placed at bedside per pt's request and given another blanket. Pt denies further needs.

## 2021-03-03 ENCOUNTER — Telehealth: Payer: Self-pay

## 2021-03-03 DIAGNOSIS — J441 Chronic obstructive pulmonary disease with (acute) exacerbation: Principal | ICD-10-CM

## 2021-03-03 LAB — BASIC METABOLIC PANEL
Anion gap: 12 (ref 5–15)
BUN: 9 mg/dL (ref 6–20)
CO2: 22 mmol/L (ref 22–32)
Calcium: 8.9 mg/dL (ref 8.9–10.3)
Chloride: 100 mmol/L (ref 98–111)
Creatinine, Ser: 0.91 mg/dL (ref 0.61–1.24)
GFR, Estimated: >60 mL/min (ref >60)
Glucose, Bld: 158 mg/dL — ABNORMAL HIGH (ref 70–99)
Potassium: 5.3 mmol/L — ABNORMAL HIGH (ref 3.5–5.1)
Sodium: 134 mmol/L — ABNORMAL LOW (ref 135–145)

## 2021-03-03 LAB — CBC
HCT: 45.2 % (ref 39.0–52.0)
Hemoglobin: 15 g/dL (ref 13.0–17.0)
MCH: 28.8 pg (ref 26.0–34.0)
MCHC: 33.2 g/dL (ref 30.0–36.0)
MCV: 86.8 fL (ref 80.0–100.0)
Platelets: 214 10*3/uL (ref 150–400)
RBC: 5.21 MIL/uL (ref 4.22–5.81)
RDW: 15.9 % — ABNORMAL HIGH (ref 11.5–15.5)
WBC: 7.8 10*3/uL (ref 4.0–10.5)
nRBC: 0 % (ref 0.0–0.2)

## 2021-03-03 LAB — HIV ANTIBODY (ROUTINE TESTING W REFLEX): HIV Screen 4th Generation wRfx: NONREACTIVE

## 2021-03-03 LAB — TSH: TSH: 0.13 u[IU]/mL — ABNORMAL LOW (ref 0.350–4.500)

## 2021-03-03 MED ORDER — PREDNISONE 20 MG PO TABS: 40.0000 mg | ORAL_TABLET | Freq: Every day | ORAL | 0 refills | Status: AC

## 2021-03-03 MED ORDER — TIOTROPIUM BROMIDE MONOHYDRATE 18 MCG IN CAPS: 18.0000 ug | ORAL_CAPSULE | Freq: Every day | RESPIRATORY_TRACT | 0 refills | Status: DC

## 2021-03-03 MED ORDER — VARENICLINE TARTRATE 0.5 MG X 11 & 1 MG X 42 PO MISC: ORAL | 0 refills | Status: DC

## 2021-03-03 MED ORDER — AZITHROMYCIN 250 MG PO TABS: 250.0000 mg | ORAL_TABLET | Freq: Every day | ORAL | 0 refills | Status: AC

## 2021-03-03 NOTE — Telephone Encounter (Signed)
Medical student called on behalf of Dr Tish Men to get a Hospital F/U for pt being discharged today for 2 week f/u  was given 8/17@9 :50 with Dr. Sloan Leiter

## 2021-03-03 NOTE — ED Notes (Signed)
Pt ambulated on room air, O2 sat fell as low as 92% but recovered without intervention to 99% during ambulation. Inpatient team updated. VSS, no distress noted.

## 2021-03-03 NOTE — ED Notes (Signed)
Pt has been asleep. Asking for ice/more to drink. Pt given both. Denies further needs.

## 2021-03-03 NOTE — ED Notes (Signed)
RN emptied urinal, titrate to RA, recheck VS, morning meds. Pt denies needs, pain. No distress noted.

## 2021-03-03 NOTE — ED Notes (Signed)
Pt verbalized understanding of d/c instructions, meds and followup care. Denies questions. VSS, no distress noted. Steady gait to exit with spouse. 

## 2021-03-03 NOTE — Discharge Instructions (Signed)
Mr. Steven, [name] were recently hospitalized at [facility] for a flare up of your COPD. We treated you with antibiotics, steroids and inhalers. You responded well to these medications so we think you are safe to continue your treatment from home. We are discharging you with 4 extra day dose of prednisone and azithromycin. Please make sure to complete this course as it will help with quick recovery.   We are also sending you home with a new inhaler called Spiriva. Please use this medication as instructed, it will help with reducing the number of emergency room visits. Lastly, as we discussed, we are sending you home with Chantix to help you stop smoking. Some side effects to look out for with this medication include: abdominal upset, nausea, vomiting and abnormal dreams. If you notice that you are having these symptoms, please let your doctor know at your next appointment.   We also have you scheduled for a follow up visit on the 17th of August at 9:45am. Please make sure to attend this appointment as we can ensure you get the lung doctor follow up you need.   It was a pleasure taking care of you. Please let us know if you have and concerns or questions. We wish you a speedy recovery and we will see you in clinic.

## 2021-03-04 LAB — T3, FREE: T3, Free: 2.6 pg/mL (ref 2.0–4.4)

## 2021-03-15 NOTE — Telephone Encounter (Signed)
Transition Care Management Follow-up Telephone Call Date of discharge and from where: Pt was discharged from the ED on 8/3. How have you been since you were released from the hospital? Pt stated he has been "ok". Any questions or concerns? Stated he was unable to get certain medications b/c they were too expensive; stated the inhalers were $500. Stated he was given a sample of one of the inhalers (he thinks Symbicort) but currently out. Also unable to afford Chantix. Stated he will talk the doctor at his appointment.  Items Reviewed: Did the pt receive and understand the discharge instructions provided? Yes   Any new allergies since your discharge? No  Dietary orders reviewed?  N/A. Do you have support at home? Yes   Home Care and Equipment/Supplies: Were home health services ordered? No.  Stated he already had a nebulizer.    Functional Questionnaire: (I = Independent and D = Dependent) ADLs: I  Bathing/Dressing- I  Meal Prep- I  Eating- I  Maintaining continence- I  Transferring/Ambulation- I  Managing Meds- I  Follow up appointments reviewed:  PCP Hospital f/u appt confirmed? Yes  Scheduled to see Dr Sloan Leiter on 03/17/21 @ 0945 AM. Specialist Hospital f/u appt confirmed?  N/A. Are transportation arrangements needed? No  If their condition worsens, is the pt aware to call PCP or go to the Emergency Dept.? Yes Was the patient provided with contact information for the PCP's office or ED? Yes Was to pt encouraged to call back with questions or concerns? Yes

## 2021-03-16 NOTE — Assessment & Plan Note (Deleted)
Patients presents with tobacco use since age 50, around 1 PPD.  In recent hospital admission, patient was encouraged to decrease amount of smoking and work towards quitting.  At discharge, he was prescribed Chantix and nicotine patches.

## 2021-03-16 NOTE — Assessment & Plan Note (Deleted)
Patient presents with recent history of admission due to presumed COPD exacerbation.  History of possible asthma and spontaneous pneumothorax at 19, but no prior pulmonary function testing.  Patient was placed on BiPAP in ED with improvement in shortness of breath. Patient received At discharge 03/03/21, patient was on Prednisone 40 mg daily for 5 days, Azithromycin 250 mg for 4 days, continuation of outpatient Symbicort and Spiriva was added due to frequency of exacerbations.   Plan: -Continue Albuterol, Symbicort, and Spiriva.  Will send refills for inhalers to outpatient pharmacy under IM program -Consider testing for alpha1 antitrypsin deficiency, PFT or pulmonary referral. -Patient is currently uninsured,

## 2021-03-17 ENCOUNTER — Encounter: Payer: Self-pay | Admitting: Internal Medicine

## 2021-03-17 DIAGNOSIS — J439 Emphysema, unspecified: Secondary | ICD-10-CM

## 2021-03-17 DIAGNOSIS — Z720 Tobacco use: Secondary | ICD-10-CM

## 2021-03-19 ENCOUNTER — Encounter: Payer: Self-pay | Admitting: Internal Medicine

## 2021-03-22 ENCOUNTER — Encounter: Payer: Self-pay | Admitting: Internal Medicine

## 2021-03-22 NOTE — Progress Notes (Deleted)
COPD- recent exacerbation Albuterol, symbicort and Spiriva new inhalers-->note from Transition of care that inhalers were to expensive    [facility] outpt pharm: Proventil, Symbicort, Spiriva Still smoking-smoked since 19 one ppd, d/c w chantix vs wellbutrin Pulm referral for PFT?  Consider alpha-1 antitrypsin w/u HTN LABs: TSH and T4

## 2021-03-23 ENCOUNTER — Other Ambulatory Visit: Payer: Self-pay

## 2021-03-23 ENCOUNTER — Other Ambulatory Visit (HOSPITAL_COMMUNITY): Payer: Self-pay

## 2021-03-23 ENCOUNTER — Encounter: Payer: Self-pay | Admitting: Internal Medicine

## 2021-03-23 ENCOUNTER — Ambulatory Visit (INDEPENDENT_AMBULATORY_CARE_PROVIDER_SITE_OTHER): Payer: Self-pay | Admitting: Internal Medicine

## 2021-03-23 VITALS — BP 156/103 | HR 80 | Temp 98.2°F | Resp 36 | Ht 70.0 in | Wt 167.8 lb

## 2021-03-23 DIAGNOSIS — I10 Essential (primary) hypertension: Secondary | ICD-10-CM

## 2021-03-23 DIAGNOSIS — J439 Emphysema, unspecified: Secondary | ICD-10-CM

## 2021-03-23 DIAGNOSIS — Z0000 Encounter for general adult medical examination without abnormal findings: Secondary | ICD-10-CM | POA: Insufficient documentation

## 2021-03-23 DIAGNOSIS — Z720 Tobacco use: Secondary | ICD-10-CM

## 2021-03-23 MED ORDER — VARENICLINE TARTRATE 1 MG PO TABS
1.0000 mg | ORAL_TABLET | Freq: Two times a day (BID) | ORAL | 0 refills | Status: AC
  Filled 2021-03-23: qty 42, 21d supply, fill #0

## 2021-03-23 MED ORDER — VARENICLINE TARTRATE 0.5 MG PO TABS
ORAL_TABLET | ORAL | 0 refills | Status: AC
  Filled 2021-03-23: qty 11, 7d supply, fill #0

## 2021-03-23 MED ORDER — DULERA 200-5 MCG/ACT IN AERO
2.0000 | INHALATION_SPRAY | Freq: Every day | RESPIRATORY_TRACT | 2 refills | Status: DC
  Filled 2021-03-23: qty 13, 30d supply, fill #0
  Filled 2021-05-17: qty 13, 30d supply, fill #1
  Filled 2021-06-21: qty 13, 30d supply, fill #2

## 2021-03-23 MED ORDER — HYDROCHLOROTHIAZIDE 12.5 MG PO TABS
12.5000 mg | ORAL_TABLET | Freq: Every day | ORAL | 2 refills | Status: DC
  Filled 2021-03-23: qty 30, 30d supply, fill #0
  Filled 2021-04-15: qty 30, 30d supply, fill #1

## 2021-03-23 MED ORDER — VARENICLINE TARTRATE 0.5 MG X 11 & 1 MG X 42 PO MISC
ORAL | 0 refills | Status: DC
  Filled 2021-03-23: qty 1, fill #0

## 2021-03-23 MED ORDER — ALBUTEROL SULFATE HFA 108 (90 BASE) MCG/ACT IN AERS
1.0000 | INHALATION_SPRAY | Freq: Four times a day (QID) | RESPIRATORY_TRACT | 2 refills | Status: DC | PRN
  Filled 2021-03-23: qty 8.5, 25d supply, fill #0
  Filled 2021-04-15: qty 8.5, 25d supply, fill #1
  Filled 2021-05-17: qty 8.5, 25d supply, fill #2

## 2021-03-23 MED ORDER — SPIRIVA HANDIHALER 18 MCG IN CAPS
18.0000 ug | ORAL_CAPSULE | Freq: Every day | RESPIRATORY_TRACT | 2 refills | Status: DC
  Filled 2021-03-23: qty 30, 30d supply, fill #0
  Filled 2021-04-15: qty 30, 30d supply, fill #1
  Filled 2021-05-17: qty 30, 30d supply, fill #2

## 2021-03-23 NOTE — Progress Notes (Addendum)
   CC: I had a COPD flare  HPI:  [name] is a 50 y.o. with a history of COPD, tobacco use disorder, and HTN who presents after an ED visit for a COPD exacerbation. He is accompanied by his fiance, [name]. He was admitted on 8/2 after presenting to [facility] ED in respiratory distress. He was treated with a course of prednisone and azithromycin, and discharged with a new medication, Spiriva, in addition to his typical regimen of Symbicort BID and Albuterol PRN. He was also started on Chantix. He states that he is significantly improved from his COPD flare and is not experiencing any shortness of breath, but he has been unable to obtain his new medications due to cost.   He has had 3 COPD exacerbations requiring ED visits this year (March, May, August). He states that his flares are triggered by smoking, seasonal allergies, and strong odors. His prior PCP was Dr. Eustace Quail at [facility] in [place]. Mr. Sheffler moved to [place] 5 months ago and would like to establish care here.   Please see problem-based charting for assessment and plan.   Medical History:  HTN  COPD  Pneumothorax at 50 y.o.  Current Medications:  Symbicort 160-4.9mcg/act inhaler 2 puffs BID Albuterol 126mcg/act inhaler 1-2 PRN shortness of breathe Cetirizine 20mg  daily  Spiriva daily   Social History:  25 pack/year smoking history. Lives in [place] with his fiance, [name]. Works as a [name], air conditioners, etc.    Review of Systems:   Review of Systems  Constitutional:  Negative for fever.  HENT: Negative.    Eyes: Negative.   Respiratory:  Positive for cough. Negative for sputum production and shortness of breath.   Cardiovascular:  Negative for chest pain and leg swelling.  Gastrointestinal: Negative.   Genitourinary: Negative.   Neurological: Negative.    Physical Exam:  Vitals:   03/23/21 1023 03/23/21 1029  BP: (!) 146/102 (!) 156/103   Pulse: 84 80  Resp: (!) 36   Temp: 98.2 F (36.8 C)   TempSrc: Oral   SpO2: 100%   Weight: 167 lb 12.8 oz (76.1 kg)   Height: 5\' 10"  (1.778 m)    Physical Exam Constitutional:      Appearance: Normal appearance.     Interventions: He is intubated.  Cardiovascular:     Rate and Rhythm: Normal rate and regular rhythm.  Pulmonary:     Effort: No respiratory distress. He is intubated.     Breath sounds: Decreased air movement present. Decreased breath sounds present. No wheezing.  Abdominal:     General: Abdomen is flat.     Palpations: Abdomen is soft.  Musculoskeletal:        General: Normal range of motion.  Skin:    General: Skin is warm and dry.  Neurological:     Mental Status: He is alert.     Assessment & Plan:   Please see Encounters Tab for problem based charting.  Patient discussed with Dr. 03/25/21 for Student Documentation:  I personally was present and performed or re-performed the history, physical exam and medical decision-making activities of this service and have verified that the service and findings are accurately documented in the student's note.  , MD 03/23/2021, 3:02 PM

## 2021-03-23 NOTE — Assessment & Plan Note (Addendum)
HPI: Patient's BP today is elevated at 156/103 with a goal of <140/80. The patient's HTN was previously well-controlled with Lisinopril, which he took for several months without any side effects. However, he noticed recently he no longer woke up with morning erections. He also had ED problems. He stopped taking lisinopril and after a few weeks he noticed his problems with ED resolved. We will not work up further today, but we discussed it is possibly a sign of vascular disease and discussed smoking cessation. We will start HCTZ today to help with blood pressure. We considered doxazosin, since it has a benefit of improving sexual function while also treating HTN, but there is evidence from the ALLHAT study that doxazosin increases risk of heart failure.   Assessment/Plan: Essential hypertension - Start Hydrochlorothiazide (HYDRODIURIL) 12.5 MG tablet; Take 1 tablet (12.5 mg total) by mouth daily.  Dispense: 30 tablet; Refill: 2 - Follow up in 4 week, will consider adding amlodipine at this time if BP elevated.

## 2021-03-23 NOTE — Patient Instructions (Signed)
Thank you, [name] for allowing [place] to provide your care today. Today we discussed your COPD, your smoking, and your blood pressure .    COPD - We have resent your Albuterol and your Spiriva to the [facility], where you can get these for $4. We have switched the Symbicort inhaler to the [facility] inhaler, and it will also be $4.  Smoking - We sent the Chantix to the [facility], where it will be $4.  Blood Pressure - Your blood pressure was high today. We switched your Lisinopril to HCTZ. Let us know if you have any side effects from the HCTZ.   I have ordered the following medication/changed the following medications:   Stop the following medications: Medications Discontinued During This Encounter  Medication Reason   budesonide-formoterol (SYMBICORT) 160-4.5 MCG/ACT inhaler Change in therapy     Start the following medications: Meds ordered this encounter  Medications   varenicline (CHANTIX PAK) 0.5 MG X 11 & 1 MG X 42 tablet    Sig: Take one 0.5 mg tablet by mouth once daily for 3 days, then increase to one 0.5 mg tablet twice daily for 4 days, then increase to one 1 mg tablet twice daily.    Dispense:  1 each    Refill:  0    IM program   albuterol (PROVENTIL HFA) 108 (90 Base) MCG/ACT inhaler    Sig: Inhale 1-2 puffs into the lungs every 6 (six) hours as needed for wheezing or shortness of breath.    Dispense:  18 g    Refill:  2    IM program   tiotropium (SPIRIVA HANDIHALER) 18 MCG inhalation capsule    Sig: Place 1 capsule (18 mcg total) into inhaler and inhale daily at 6 (six) AM.    Dispense:  30 capsule    Refill:  2    IM program   mometasone-formoterol (DULERA) 200-5 MCG/ACT AERO    Sig: Inhale 2 puffs into the lungs daily.    Dispense:  1 each    Refill:  2    IM program   hydrochlorothiazide (HYDRODIURIL) 12.5 MG tablet    Sig: Take 1 tablet (12.5 mg total) by mouth daily.    Dispense:  30 tablet    Refill:  2    IM  program      Follow up:  1 month     Remember: If you notice your COPD is starting to worsen, come see [place] before it gets bad enough for the Emergency Room.  Should you have any questions or concerns please call the internal medicine clinic at [phone].     Dayna Barker, Medical Student Thurmon Fair, M.D. [facility]

## 2021-03-23 NOTE — Assessment & Plan Note (Signed)
Patient has never had a coloscopy. He does not have a family history of colon cancer. We will revisit this topic at his next visit.   Plan:  Follow-up in 4 weeks

## 2021-03-23 NOTE — Assessment & Plan Note (Addendum)
Patient was diagnosed with severe chronic bullous emphysema in October 2021 at [facility]. He has a 33 pack/year smoking history. He typically has 1-2 COPD exacerbations requiring an ED visit per year, although this year he has had 3 (March, May, August). His flares are triggered by smoking, seasonal allergies, and strong odors. He has never had PFTs or seen a pulmonologist. He notes occasional peripheral edema that resolves when he elevates his feet. He has never had an echo. He was recently started on Spiriva but has been unable to obtain it because he is uninsured and was told it is $500. We will resend his medications to [facility] as part of the IM Program as all of them except the Symbicort are on the $4 list. We will replace Symbicort with an equivalent, Dulera. We discussed the importance of smoking cessation (see tobacco use problem) to prevent worsening of his disease. We also discussed that he should come here to be seen when he begins to notice a flare up occuring, as opposed to waiting until the severity warrants an ED visit.   Plan:   -Albuterol, Elwin Sleight, Spiriva sent to [facility] as part of IM Program  -Continue OTC Cetirizine 20mg  daily  -Follow-up in 4 weeks

## 2021-03-23 NOTE — Assessment & Plan Note (Signed)
Patient has a 33 pack/yr smoking history. He is motivated to quit smoking and he recognizes that smoking directly worsens his COPD. He has tried nicotine gum in the past but it did not help. He was prescribed Chantix by the ED at his most recent visit on 8/2 but was unable to obtain it due to cost. We will re-send Chantix using our IM Program discount as it is covered on the $4 list. We will follow-up in 4 weeks to see how he is tolerating Chantix.   Plan:  -Start Chantix  -Follow-up in 4 weeks

## 2021-03-30 NOTE — Progress Notes (Signed)
Internal Medicine Clinic Attending  Case discussed with Dr. Steen  At the time of the visit.  We reviewed the resident's history and exam and pertinent patient test results.  I agree with the assessment, diagnosis, and plan of care documented in the resident's note.  

## 2021-04-06 ENCOUNTER — Telehealth: Payer: Self-pay | Admitting: Internal Medicine

## 2021-04-06 ENCOUNTER — Ambulatory Visit (INDEPENDENT_AMBULATORY_CARE_PROVIDER_SITE_OTHER): Payer: Self-pay | Admitting: Internal Medicine

## 2021-04-06 ENCOUNTER — Other Ambulatory Visit (HOSPITAL_COMMUNITY): Payer: Self-pay

## 2021-04-06 DIAGNOSIS — J302 Other seasonal allergic rhinitis: Secondary | ICD-10-CM

## 2021-04-06 MED ORDER — FLUTICASONE PROPIONATE 50 MCG/ACT NA SUSP
1.0000 | Freq: Every day | NASAL | 2 refills | Status: DC
  Filled 2021-04-07: qty 16, 60d supply, fill #0
  Filled 2022-01-27: qty 16, 60d supply, fill #1

## 2021-04-06 MED ORDER — CETIRIZINE HCL 10 MG PO TABS: 10.0000 mg | ORAL_TABLET | Freq: Every day | ORAL | 0 refills | Status: DC

## 2021-04-06 NOTE — Telephone Encounter (Signed)
Ok

## 2021-04-06 NOTE — Telephone Encounter (Signed)
Called pt - stated he prefers telehealth appt at this time.

## 2021-04-06 NOTE — Progress Notes (Signed)
  [facility] Internal Medicine Residency Telephone Encounter Continuity Care Appointment  HPI:  This telephone encounter was created for [name] on 04/06/2021 for the evaluation of nasal congestion. Please refer to problem based charting for assessment and plan.   Past Medical History:  Past Medical History:  Diagnosis Date   Asthma    COPD (chronic obstructive pulmonary disease) (HCC)    Hypertension       Assessment / Plan / Recommendations:  Problem List Items Addressed This Visit       Respiratory   Seasonal allergic rhinitis    He requested a telehealth visit today to discuss respiratory symptoms that started 9/4.  He is experienced some mild shortness of breath however on further discussion with him, primary complaint is sinus congestion.  He notes that this is affected his breathing but denies increased use of albuterol or a productive cough.  He denies fever, chills, chest pain. He notes that he has been out of Zyrtec. He also notes that he has been tobacco free for about a week but has noticed that the above symptoms correlated with when he increased the Chantix dose that he recently started.  Assessment: His overall symptoms are consistent with sinus congestion.  This is most likely due to allergic rhinitis and not taking Zyrtec however cannot rule out an infectious cause.  He reports having received appropriate COVID-19 vaccinations.  In the absence of other systemic symptoms of infection I think this is less likely.  In regards to a COPD exacerbation, based on our discussion today, I would consider this fairly low on the differential however I explained to them that if symptoms worsen, he should call us back and arrange in person visit.  Plan Resume Zyrtec (refilled and sent to pharmacy) Start Flonase Follow-up with Dr. Barbaraann Faster as arranged for next week Return precautions discussed       As always, pt is advised that if symptoms worsen or new symptoms arise,  they should go to an urgent care facility or to to ER for further evaluation.   Consent and Medical Decision Making:  Patient discussed with Dr.  Mayford Knife This is a telephone encounter between Steven [name] and Nancy Arvin on 04/06/2021 for sinus congestion. The visit was conducted with the patient located at home and [employer] at [facility]. The patient's identity was confirmed using their DOB and current address. The patient has consented to being evaluated through a telephone encounter and understands the associated risks (an examination cannot be done and the patient may need to come in for an appointment) / benefits (allows the patient to remain at home, decreasing exposure to coronavirus). I personally spent 25 minutes on medical discussion.    Elige Radon, MD Internal Medicine Resident PGY-3 [facility] Pager: [phone] 04/06/2021 6:08 PM

## 2021-04-06 NOTE — Telephone Encounter (Signed)
Please call back to [facility]) to the patient as the patient has been having breathing problems over the weekend and she would like to know what to do.

## 2021-04-06 NOTE — Telephone Encounter (Signed)
I would prefer that he be seen in the office. Is he willing to come in?

## 2021-04-06 NOTE — Assessment & Plan Note (Signed)
He requested a telehealth visit today to discuss respiratory symptoms that started 9/4.  He is experienced some mild shortness of breath however on further discussion with him, primary complaint is sinus congestion.  He notes that this is affected his breathing but denies increased use of albuterol or a productive cough.  He denies fever, chills, chest pain. He notes that he has been out of Zyrtec. He also notes that he has been tobacco free for about a week but has noticed that the above symptoms correlated with when he increased the Chantix dose that he recently started.  Assessment: His overall symptoms are consistent with sinus congestion.  This is most likely due to allergic rhinitis and not taking Zyrtec however cannot rule out an infectious cause.  He reports having received appropriate COVID-19 vaccinations.  In the absence of other systemic symptoms of infection I think this is less likely.  In regards to a COPD exacerbation, based on our discussion today, I would consider this fairly low on the differential however I explained to them that if symptoms worsen, he should call us back and arrange in person visit.  Plan  Resume Zyrtec (refilled and sent to pharmacy)  Start Flonase  Follow-up with Dr. Barbaraann Faster as arranged for next week  Return precautions discussed

## 2021-04-06 NOTE — Telephone Encounter (Signed)
Return call to pt's significant other, [name], who stated pt is having difficulty breathing as before; last seen 8/23. And was told to call the office first instead of going to the ED and the doctor will order medication. Stated Prednisone helped before. Stated symptoms started Friday - coughing, wheezing and sob. Telehealth appt given today @ 1515 PM with Dr Ephriam Knuckles. She stated to call the pt on his phone [phone].

## 2021-04-07 ENCOUNTER — Other Ambulatory Visit (HOSPITAL_COMMUNITY): Payer: Self-pay

## 2021-04-08 ENCOUNTER — Ambulatory Visit (INDEPENDENT_AMBULATORY_CARE_PROVIDER_SITE_OTHER): Payer: Self-pay | Admitting: Internal Medicine

## 2021-04-08 ENCOUNTER — Encounter: Payer: Self-pay | Admitting: Internal Medicine

## 2021-04-08 ENCOUNTER — Other Ambulatory Visit: Payer: Self-pay

## 2021-04-08 ENCOUNTER — Other Ambulatory Visit (HOSPITAL_COMMUNITY): Payer: Self-pay

## 2021-04-08 VITALS — BP 152/105 | HR 100 | Temp 97.8°F | Ht 70.0 in | Wt 163.0 lb

## 2021-04-08 DIAGNOSIS — J441 Chronic obstructive pulmonary disease with (acute) exacerbation: Secondary | ICD-10-CM

## 2021-04-08 DIAGNOSIS — R32 Unspecified urinary incontinence: Secondary | ICD-10-CM

## 2021-04-08 MED ORDER — METHYLPREDNISOLONE SODIUM SUCC 125 MG IJ SOLR
60.0000 mg | Freq: Once | INTRAMUSCULAR | Status: AC
  Administered 2021-04-08: 60 mg via INTRAMUSCULAR

## 2021-04-08 MED ORDER — PREDNISONE 20 MG PO TABS
ORAL_TABLET | ORAL | 0 refills | Status: DC
  Filled 2021-04-08: qty 23, 10d supply, fill #0

## 2021-04-08 NOTE — Progress Notes (Signed)
Office Visit   Patient ID: Steven [name], male    DOB: 1971-05-05, 50 y.o.   MRN: [id number]   PCP: Adron Bene, MD   Subjective:  Steven [name] is a 50 y.o. year old male with severe COPD who presents for follow up from his telehealth visit with me yesterday. Please refer to problem based charting for assessment and plan.    Objective:   BP (!) 152/105 (BP Location: Right Arm, Patient Position: Sitting, Cuff Size: Normal)   Pulse 100   Temp 97.8 F (36.6 C) (Oral)   Ht 5\' 10"  (1.778 m)   Wt 163 lb (73.9 kg)   SpO2 97%   BMI 23.39 kg/m  BP Readings from Last 3 Encounters:  04/08/21 (!) 152/105  03/23/21 (!) 156/103  03/03/21 120/75   General: thin male in no acute while resting; sitting in wheelchair Cardiac: tachycardic rate, regular rhythm Pulm: breathing appears fairly comfortable on room air while seating. No tripoding or pursed lip breathing. Not tachypnic. Coughs intermittently, producing clear sputum. Severely diminished lung sounds throughout all lung fields on auscultation. Assessment & Plan:   Problem List Items Addressed This Visit       Respiratory   COPD exacerbation (HCC)    He was recently admitted on 8/2 for a COPD exacerbation, initially requiring BiPAP. He received solumedrol and nebulizers and symptoms quickly improved. Later that day, he was ambulating on room air while maintaining O2 saturations so he was subsequently discharged with the addition of spiriva and a 5d course of 40mg  prednisone.   Of note, he has been to the ED 5 times in the past 12 months for COPD exacerbations.   HPI: He presents to the office today for follow up of the telehealth visit that he had with me yesterday. He is a fairly poor historian, down playing symptoms. His significant other was present at today's visit and contributes to the history.  Severe worsening of symptoms prompted today's evalation. His significant other noticed that he began having troubles ambulating to  the bathroom due to the shortness of breath this morning and became incontinent of urine. She reports that he had no problem ambulating out to the car yesterday. She feels that his symptoms never fully recovered after hospital discharge.  Exam: breathing appears fairly comfortable on room air while seating. No tripoding or pursed lip breathing. Not tachypnic. Coughs intermittently, producing clear sputum. Severely diminished lung sounds throughout all lung fields on auscultation. Ambulatory oxygen saturations dropped to 86%. He developed severe shortness of breath with this requiring a wheelchair to return to the room. During this episode he developed urinary incontinence which he says happened earlier today and during his first COPD exacerbation due to panic from the shortness of breath. He is otherwise continent and does not experience similar symptoms.   Assessment: Exacerbation of Severe bullous emphysematous COPD. Disease management is complicated by financial constraints and uninsured status. He is high risk for further decompensation. I recommended hospital admission. He declined, stating that he would be ok. I explained the risk for progression of respiratory failure that could result in death however he continued to decline admission. I discussed the risk for pneumothorax in the setting of severe bullous emphysema.  I suspect that he requires a longer steroid taper due to the severity of his disease process. He does not have additional symptoms that suggest an infectious cause, so will defer antibiotic treatment for now.  His inhaler regimen is already maximized within the confines of  financial constraints and primary care management. I discussed that he ultimately needs to see pulmonology and acknowledged the lack of insurance being a prohibiting factor. Kayde's significant other is working on completing the orange card paperwork. I explained that this does not mean that he will automatically get  to see pulmonology as the offices have set numbers of orange card patients that they accept. I recommended that they expedite the completion of the orange card application and consider alternative options in preparation for continued disease progression.  Plan 60mg  solumedrol IM provided in the clinic today Start prednisone taper tomorrow: 60mg  daily x5 days, followed by 40mg  daily x3 days, followed by 20mg  daily x2 days. 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  Ambulatory oxygen saturations dropped to 86%. He developed severe shortness of breath with this requiring a wheelchair to return to the room. During this episode he developed urinary incontinence which he says happened earlier today and during his first COPD exacerbation due to panic from the shortness of breath. He is otherwise continent and does not experience similar symptoms.  I do not think the incontinence requires further investigation at this time. Should he develop more frequent incidents or he develops symptoms of BPH, further workup can be considered.        Return in about 4 days (around 04/12/2021).   Pt discussed with Dr. , MD Internal Medicine Resident PGY-3 Internal Medicine Residency 04/08/2021 6:05 PM

## 2021-04-08 NOTE — Patient Instructions (Signed)
If you develop worsening symptoms, please seek further evaluation in the emergency department.

## 2021-04-08 NOTE — Assessment & Plan Note (Addendum)
He was recently admitted on 8/2 for a COPD exacerbation, initially requiring BiPAP. He received solumedrol and nebulizers and symptoms quickly improved. Later that day, he was ambulating on room air while maintaining O2 saturations so he was subsequently discharged with the addition of spiriva and a 5d course of 40mg  prednisone.   Of note, he has been to the ED 5 times in the past 12 months for COPD exacerbations.   HPI: He presents to the office today for follow up of the telehealth visit that he had with me yesterday. He is a fairly poor historian, down playing symptoms. His significant other was present at today's visit and contributes to the history.  Severe worsening of symptoms prompted today's evalation. His significant other noticed that he began having troubles ambulating to the bathroom due to the shortness of breath this morning and became incontinent of urine. She reports that he had no problem ambulating out to the car yesterday. She feels that his symptoms never fully recovered after hospital discharge.  Exam: breathing appears fairly comfortable on room air while seating. No tripoding or pursed lip breathing. Not tachypnic. Coughs intermittently, producing clear sputum. Severely diminished lung sounds throughout all lung fields on auscultation. Ambulatory oxygen saturations dropped to 86%. He developed severe shortness of breath with this requiring a wheelchair to return to the room. During this episode he developed urinary incontinence which he says happened earlier today and during his first COPD exacerbation due to panic from the shortness of breath. He is otherwise continent and does not experience similar symptoms.   Assessment: Exacerbation of Severe bullous emphysematous COPD. Disease management is complicated by financial constraints and uninsured status. He is high risk for further decompensation. I recommended hospital admission. He declined, stating that he would be ok. I  explained the risk for progression of respiratory failure that could result in death however he continued to decline admission. I discussed the risk for pneumothorax in the setting of severe bullous emphysema.  I suspect that he requires a longer steroid taper due to the severity of his disease process. He does not have additional symptoms that suggest an infectious cause, so will defer antibiotic treatment for now.  His inhaler regimen is already maximized within the confines of financial constraints and primary care management. I discussed that he ultimately needs to see pulmonology and acknowledged the lack of insurance being a prohibiting factor. Vayden's significant other is working on completing the orange card paperwork. I explained that this does not mean that he will automatically get to see pulmonology as the offices have set numbers of orange card patients that they accept. I recommended that they expedite the completion of the orange card application and consider alternative options in preparation for continued disease progression.  Plan  60mg  solumedrol IM provided in the clinic today  Start prednisone taper tomorrow: 60mg  daily x5 days, followed by 40mg  daily x3 days, followed by 20mg  daily x2 days.  He declines hospital admission today; Recommend evaluation in the ED for worsening symptoms  Follow up 9/12 in the office

## 2021-04-08 NOTE — Assessment & Plan Note (Signed)
This office visit was for COPD exacerbation management.  Ambulatory oxygen saturations dropped to 86%. He developed severe shortness of breath with this requiring a wheelchair to return to the room. During this episode he developed urinary incontinence which he says happened earlier today and during his first COPD exacerbation due to panic from the shortness of breath. He is otherwise continent and does not experience similar symptoms.  I do not think the incontinence requires further investigation at this time. Should he develop more frequent incidents or he develops symptoms of BPH, further workup can be considered.

## 2021-04-09 ENCOUNTER — Other Ambulatory Visit (HOSPITAL_COMMUNITY): Payer: Self-pay

## 2021-04-15 ENCOUNTER — Telehealth: Payer: Self-pay | Admitting: *Deleted

## 2021-04-15 NOTE — Telephone Encounter (Signed)
Return call from pt. Stated he's feeling good. Stated he has 2 or 3 more days of Prednisone. Stated the shot (solumedrol) and prednisone have him almost back to his normal self. Stated he had forgotten to schedule a f/u visit.

## 2021-04-15 NOTE — Telephone Encounter (Signed)
Called pt - no answer; left message to call the office . 

## 2021-04-15 NOTE — Telephone Encounter (Signed)
Thank you :)

## 2021-04-15 NOTE — Telephone Encounter (Signed)
Thanks Glenda! 

## 2021-04-15 NOTE — Telephone Encounter (Signed)
-----   Message from Elige Radon, MD sent at 04/15/2021  2:42 PM EDT ----- This is the gentleman that I saw last week with the severe COPD exacerbation. He was supposed to come back this week. Can we check in on him and make sure he's getting better?  Thanks, Rylee

## 2021-04-16 ENCOUNTER — Other Ambulatory Visit (HOSPITAL_COMMUNITY): Payer: Self-pay

## 2021-04-19 NOTE — Progress Notes (Signed)
Internal Medicine Clinic Attending  Case discussed with Dr. Christian  At the time of the visit.  We reviewed the resident's history and exam and pertinent patient test results.  I agree with the assessment, diagnosis, and plan of care documented in the resident's note.  

## 2021-04-20 ENCOUNTER — Encounter: Payer: Self-pay | Admitting: Internal Medicine

## 2021-04-20 ENCOUNTER — Other Ambulatory Visit: Payer: Self-pay

## 2021-04-20 ENCOUNTER — Other Ambulatory Visit (HOSPITAL_COMMUNITY): Payer: Self-pay

## 2021-04-20 ENCOUNTER — Ambulatory Visit (INDEPENDENT_AMBULATORY_CARE_PROVIDER_SITE_OTHER): Payer: Self-pay | Admitting: Internal Medicine

## 2021-04-20 DIAGNOSIS — I10 Essential (primary) hypertension: Secondary | ICD-10-CM

## 2021-04-20 DIAGNOSIS — Z720 Tobacco use: Secondary | ICD-10-CM

## 2021-04-20 DIAGNOSIS — Z0000 Encounter for general adult medical examination without abnormal findings: Secondary | ICD-10-CM

## 2021-04-20 DIAGNOSIS — J441 Chronic obstructive pulmonary disease with (acute) exacerbation: Secondary | ICD-10-CM

## 2021-04-20 MED ORDER — HYDROCHLOROTHIAZIDE 12.5 MG PO TABS: 25.0000 mg | ORAL_TABLET | Freq: Every day | ORAL | 2 refills | Status: DC

## 2021-04-20 MED ORDER — VARENICLINE TARTRATE 0.5 MG PO TABS: 0.5000 mg | ORAL_TABLET | Freq: Two times a day (BID) | ORAL | 3 refills | Status: DC

## 2021-04-20 MED ORDER — VARENICLINE TARTRATE 0.5 MG PO TABS
0.5000 mg | ORAL_TABLET | Freq: Two times a day (BID) | ORAL | 3 refills | Status: DC
  Filled 2021-04-20: qty 30, 15d supply, fill #0

## 2021-04-20 MED ORDER — HYDROCHLOROTHIAZIDE 25 MG PO TABS
25.0000 mg | ORAL_TABLET | Freq: Every day | ORAL | 11 refills | Status: DC
  Filled 2021-04-20: qty 30, 30d supply, fill #0
  Filled 2021-05-17: qty 30, 30d supply, fill #1
  Filled 2021-06-16: qty 30, 30d supply, fill #2
  Filled 2021-07-18: qty 30, 30d supply, fill #3
  Filled 2021-09-15: qty 30, 30d supply, fill #4
  Filled 2021-10-22: qty 30, 30d supply, fill #5
  Filled 2021-11-17: qty 30, 30d supply, fill #6
  Filled 2021-12-22: qty 30, 30d supply, fill #7

## 2021-04-20 NOTE — Assessment & Plan Note (Signed)
Currently on albuterol 90, dulera, and tiotropium 18. Steven [name] was recently switched for Symbicort. Patient has had multiple ED visits in the past year, and reports a recent exacerbation of his COPD roughly 1-2 weeks ago, even after switching to new Rx and required prednisone taper. He does not have a pulmonologist and has never had PFTs done, however, he is currently in the process of receiving  His orange card.  COPD currently poorly controlled, despite being on multiple appropriate medications. He reports that he uses a spacer for his albuterol inhaler, but not his Dulera. Suspect that this may be contributing to poor control over disease. Patient will need to complete registration for orange card. Will refer to pulmonologist once that is complete. Also discussed that smoking cessation is going to be the most important change to help gain control over his disease.

## 2021-04-20 NOTE — Assessment & Plan Note (Signed)
33 pack year smoking history. He is motivated to quit and currently only smokes approximately 6 cigarettes per day. He has tried nicotine gum in the past but it did not help. Previously prescribed Chantix. Tolerated lower dose well, but was unable to tolerate higher dose due to dry mouth and feeling short of breath. Reports  That lower dose did help decrease craving.   Will restart on 0.5mg  chantix.

## 2021-04-20 NOTE — Assessment & Plan Note (Signed)
Previously well controlled with lisinopril, however, he stopped taking this medication due to erectile dysfunction. Was switched to HCTZ 12.5mg  instead for BP control. BP remains elevated today. No HA, CP, vision changes.   BP near goal, but remains elevated. Will increase HCTZ to 25.

## 2021-04-20 NOTE — Assessment & Plan Note (Signed)
Discussed with the patient that given his lung disease, a flu shot is recommended.  Flu shot administered today.

## 2021-04-20 NOTE — Progress Notes (Signed)
CC: 4-week follow-up  HPI:Steven [name] is a 50 y.o. male who presents for evaluation of 4-week follow-up. Please see individual problem based A/P for details.   Problem List Items Addressed This Visit       Cardiovascular and Mediastinum   Essential hypertension    Previously well controlled with lisinopril, however, he stopped taking this medication due to erectile dysfunction. Was switched to HCTZ 12.5mg  instead for BP control. BP remains elevated today. No HA, CP, vision changes.   BP near goal, but remains elevated. Will increase HCTZ to 25.      Relevant Medications   hydrochlorothiazide (HYDRODIURIL) 25 MG tablet     Respiratory   COPD exacerbation (HCC)    Currently on albuterol 90, dulera, and tiotropium 18. Elwin Sleight was recently switched for Symbicort. Patient has had multiple ED visits in the past year, and reports a recent exacerbation of his COPD roughly 1-2 weeks ago, even after switching to new Rx and required prednisone taper. He does not have a pulmonologist and has never had PFTs done, however, he is currently in the process of receiving  His orange card.  COPD currently poorly controlled, despite being on multiple appropriate medications. He reports that he uses a spacer for his albuterol inhaler, but not his Dulera. Suspect that this may be contributing to poor control over disease. Patient will need to complete registration for orange card. Will refer to pulmonologist once that is complete. Also discussed that smoking cessation is going to be the most important change to help gain control over his disease.       Relevant Medications   varenicline (CHANTIX) 0.5 MG tablet     Other   Tobacco use    33 pack year smoking history. He is motivated to quit and currently only smokes approximately 6 cigarettes per day. He has tried nicotine gum in the past but it did not help. Previously prescribed Chantix. Tolerated lower dose well, but was unable to tolerate higher  dose due to dry mouth and feeling short of breath. Reports  That lower dose did help decrease craving.   Will restart on 0.5mg  chantix.      Health care maintenance    Discussed with the patient that given his lung disease, a flu shot is recommended.  Flu shot administered today.        Depression, PHQ-9: Based on the patients  Flowsheet Row Office Visit from 04/08/2021 in [facility]  PHQ-9 Total Score 1      score we have 1.  Past Medical History:  Diagnosis Date   Asthma    COPD (chronic obstructive pulmonary disease) (HCC)    Hypertension    Review of Systems:   Review of Systems  Constitutional: Negative.   HENT: Negative.    Respiratory: Negative.    Cardiovascular: Negative.   Gastrointestinal: Negative.   Genitourinary: Negative.   Musculoskeletal: Negative.   Skin: Negative.   Neurological: Negative.   Psychiatric/Behavioral: Negative.      Physical Exam: Vitals:   04/20/21 0931 04/20/21 1029  BP: (!) 146/89 136/78  Pulse: 97 81  Temp: 98 F (36.7 C)   TempSrc: Oral   SpO2: 100%   Weight: 168 lb 14.4 oz (76.6 kg)   Height: 5\' 10"  (1.778 m)      General: alert and oriented, no acute distress HEENT: Conjunctiva nl , antiicteric sclerae, moist mucous membranes, no exudate or erythema Cardiovascular: Normal rate, regular rhythm.  No murmurs, rubs,  or gallops Pulmonary : Equal breath sounds, No wheezes, rales, or rhonchi Abdominal: soft, nontender,  bowel sounds present Ext: No edema in lower extremities, no tenderness to palpation of lower extremities.   Assessment & Plan:   See Encounters Tab for problem based charting.  Patient seen with Dr. Cleda Daub

## 2021-04-20 NOTE — Patient Instructions (Signed)
Dear Steven [name],  Thank you for trusting [place] with your care today.  Today we discussed your high blood pressure tobacco use and COPD.  We increased your hydrochlorothiazide to 25 mg.  Please take this pill once a day.  As for your tobacco use, we represcribed the Chantix at the lower dose.  Please take this to help with cravings and decrease smoking.  As for your COPD we discussed using the spacer for your [facility] to help ensure that that medication as inhale deeply into your lungs.  We also recommend that you continue to work on getting her orange card so that you can be seen by a pulmonologist.  We also administered the flu shot today due to your risk factors.

## 2021-04-23 NOTE — Progress Notes (Signed)
Internal Medicine Clinic Attending  I saw and evaluated the patient.  I personally confirmed the key portions of the history and exam documented by Dr. Burnice Logan   and I reviewed pertinent patient test results.  The assessment, diagnosis, and plan were formulated together and I agree with the documentation in the resident's note.  I encouraged him to bring inhalers to next visit so that we may be able to watch him use them as I suspect this may be playing a role.

## 2021-05-17 ENCOUNTER — Other Ambulatory Visit (HOSPITAL_COMMUNITY): Payer: Self-pay

## 2021-05-21 ENCOUNTER — Telehealth: Payer: Self-pay | Admitting: Internal Medicine

## 2021-05-21 NOTE — Telephone Encounter (Signed)
Refill Request- Pt requesting a refill and would like a call back if possible.  Pt states he believes he is having a flare up of his COPD.    predniSONE (DELTASONE) 20 MG tablet   [facility] (Ph: [phone])

## 2021-05-21 NOTE — Telephone Encounter (Signed)
RTC, Patient is requesting more prednisone, he states with the weather changing he feels he is going to have a flare of his COPD soon.  He is not wheezing, but "feels it is coming on". Pt informed he will need to be seen in clinic for evaluation. Appt made with yellow team for Monday, 10/24 @ 1445/Dr. Jinwala \\SChaplin , RN,BSN

## 2021-05-24 ENCOUNTER — Encounter: Payer: Self-pay | Admitting: Student

## 2021-05-26 ENCOUNTER — Encounter: Payer: Self-pay | Admitting: Student

## 2021-06-16 ENCOUNTER — Other Ambulatory Visit (HOSPITAL_COMMUNITY): Payer: Self-pay

## 2021-06-16 ENCOUNTER — Other Ambulatory Visit: Payer: Self-pay | Admitting: Internal Medicine

## 2021-06-18 ENCOUNTER — Other Ambulatory Visit (HOSPITAL_COMMUNITY): Payer: Self-pay

## 2021-06-18 ENCOUNTER — Other Ambulatory Visit: Payer: Self-pay | Admitting: Internal Medicine

## 2021-06-18 MED ORDER — SPIRIVA HANDIHALER 18 MCG IN CAPS
18.0000 ug | ORAL_CAPSULE | Freq: Every day | RESPIRATORY_TRACT | 2 refills | Status: DC
  Filled 2021-06-18: qty 30, 30d supply, fill #0
  Filled 2021-07-18: qty 30, 30d supply, fill #1
  Filled 2021-08-17: qty 30, 30d supply, fill #2

## 2021-06-21 ENCOUNTER — Other Ambulatory Visit: Payer: Self-pay | Admitting: Internal Medicine

## 2021-06-21 ENCOUNTER — Other Ambulatory Visit (HOSPITAL_COMMUNITY): Payer: Self-pay

## 2021-06-22 ENCOUNTER — Other Ambulatory Visit (HOSPITAL_COMMUNITY): Payer: Self-pay

## 2021-06-22 MED ORDER — ALBUTEROL SULFATE HFA 108 (90 BASE) MCG/ACT IN AERS
1.0000 | INHALATION_SPRAY | Freq: Four times a day (QID) | RESPIRATORY_TRACT | 2 refills | Status: DC | PRN
  Filled 2021-06-22: qty 6.7, 25d supply, fill #0
  Filled 2021-07-11: qty 18, 25d supply, fill #0
  Filled 2021-09-15: qty 18, 25d supply, fill #1
  Filled 2021-10-22: qty 18, 25d supply, fill #2

## 2021-06-30 ENCOUNTER — Other Ambulatory Visit (HOSPITAL_COMMUNITY): Payer: Self-pay

## 2021-07-11 ENCOUNTER — Other Ambulatory Visit: Payer: Self-pay | Admitting: Internal Medicine

## 2021-07-12 ENCOUNTER — Other Ambulatory Visit (HOSPITAL_COMMUNITY): Payer: Self-pay

## 2021-07-12 MED ORDER — DULERA 200-5 MCG/ACT IN AERO
2.0000 | INHALATION_SPRAY | Freq: Every day | RESPIRATORY_TRACT | 2 refills | Status: DC
  Filled 2021-07-12 – 2021-08-17 (×2): qty 13, 30d supply, fill #0
  Filled 2021-10-22: qty 13, 30d supply, fill #1

## 2021-07-19 ENCOUNTER — Other Ambulatory Visit (HOSPITAL_COMMUNITY): Payer: Self-pay

## 2021-07-20 ENCOUNTER — Other Ambulatory Visit (HOSPITAL_COMMUNITY): Payer: Self-pay

## 2021-07-21 NOTE — Progress Notes (Incomplete)
° °  New Patient Office Visit  Subjective:  Patient ID: Steven [name], male    DOB: [birth year]  Age: 50 y.o. MRN: [id number]  CC: No chief complaint on file.   HPI Steven [name] presents for ***  Past Medical History:  Diagnosis Date   Asthma    COPD (chronic obstructive pulmonary disease) (HCC)    Hypertension     No past surgical history on file.  No family history on file.  Social History   Socioeconomic History   Marital status: Single    Spouse name: Not on file   Number of children: Not on file   Years of education: Not on file   Highest education level: Not on file  Occupational History   Not on file  Tobacco Use   Smoking status: Every Day    Packs/day: 1.00    Types: Cigarettes   Smokeless tobacco: Never   Tobacco comments:    Wants to get med to quit  Substance and Sexual Activity   Alcohol use: Yes   Drug use: Not Currently   Sexual activity: Not on file  Other Topics Concern   Not on file  Social History Narrative   Not on file   Social Determinants of Health   Financial Resource Strain: Not on file  Food Insecurity: Not on file  Transportation Needs: Not on file  Physical Activity: Not on file  Stress: Not on file  Social Connections: Not on file  Intimate Partner Violence: Not on file    ROS Review of Systems  Objective:   Today's Vitals: There were no vitals taken for this visit.  Physical Exam  Assessment & Plan:   Problem List Items Addressed This Visit   None   Outpatient Encounter Medications as of 07/22/2021  Medication Sig   albuterol (PROAIR HFA) 108 (90 Base) MCG/ACT inhaler Inhale 1-2 puffs into the lungs every 6 (six) hours as needed for wheezing or shortness of breath.   cetirizine (ZYRTEC) 10 MG tablet Take 1 tablet (10 mg total) by mouth daily.   fluticasone (FLONASE) 50 MCG/ACT nasal spray Place 1 spray into both nostrils daily.   hydrochlorothiazide (HYDRODIURIL) 25 MG tablet Take 1 tablet  (25 mg total) by mouth daily.   mometasone-formoterol (DULERA) 200-5 MCG/ACT AERO Inhale 2 puffs into the lungs daily.   nicotine polacrilex (NICORETTE) 4 MG gum Take by mouth.   predniSONE (DELTASONE) 20 MG tablet Take 3 tablets (60mg ) by mouth daily for the first 5 days, followed by 2 tablets daily for three days, followed by 1 tablet daily for two days.   tiotropium (SPIRIVA HANDIHALER) 18 MCG inhalation capsule Place 1 capsule (18 mcg total) into inhaler and inhale daily at 6 (six) AM.   varenicline (CHANTIX) 0.5 MG tablet Take 1 tablet (0.5 mg total) by mouth 2 (two) times daily.   No facility-administered encounter medications on file as of 07/22/2021.    Follow-up: No follow-ups on file.   07/24/2021, MD

## 2021-07-22 ENCOUNTER — Ambulatory Visit: Payer: Self-pay | Admitting: Critical Care Medicine

## 2021-08-17 ENCOUNTER — Other Ambulatory Visit (HOSPITAL_COMMUNITY): Payer: Self-pay

## 2021-08-18 ENCOUNTER — Other Ambulatory Visit (HOSPITAL_COMMUNITY): Payer: Self-pay

## 2021-09-15 ENCOUNTER — Other Ambulatory Visit: Payer: Self-pay | Admitting: Internal Medicine

## 2021-09-16 ENCOUNTER — Other Ambulatory Visit (HOSPITAL_COMMUNITY): Payer: Self-pay

## 2021-09-17 ENCOUNTER — Other Ambulatory Visit: Payer: Self-pay | Admitting: Internal Medicine

## 2021-09-17 ENCOUNTER — Other Ambulatory Visit (HOSPITAL_COMMUNITY): Payer: Self-pay

## 2021-09-18 MED ORDER — SPIRIVA HANDIHALER 18 MCG IN CAPS
18.0000 ug | ORAL_CAPSULE | Freq: Every day | RESPIRATORY_TRACT | 2 refills | Status: DC
  Filled 2021-09-18: qty 30, 30d supply, fill #0
  Filled 2021-10-22: qty 30, 30d supply, fill #1
  Filled 2021-11-24: qty 30, 30d supply, fill #2

## 2021-09-20 ENCOUNTER — Other Ambulatory Visit (HOSPITAL_COMMUNITY): Payer: Self-pay

## 2021-10-22 ENCOUNTER — Other Ambulatory Visit (HOSPITAL_COMMUNITY): Payer: Self-pay

## 2021-11-17 ENCOUNTER — Other Ambulatory Visit (HOSPITAL_COMMUNITY): Payer: Self-pay

## 2021-11-17 ENCOUNTER — Other Ambulatory Visit: Payer: Self-pay | Admitting: Internal Medicine

## 2021-11-18 ENCOUNTER — Other Ambulatory Visit (HOSPITAL_COMMUNITY): Payer: Self-pay

## 2021-11-19 ENCOUNTER — Other Ambulatory Visit (HOSPITAL_COMMUNITY): Payer: Self-pay

## 2021-11-22 ENCOUNTER — Other Ambulatory Visit: Payer: Self-pay | Admitting: Internal Medicine

## 2021-11-22 ENCOUNTER — Other Ambulatory Visit (HOSPITAL_COMMUNITY): Payer: Self-pay

## 2021-11-25 ENCOUNTER — Other Ambulatory Visit (HOSPITAL_COMMUNITY): Payer: Self-pay

## 2021-11-26 ENCOUNTER — Other Ambulatory Visit (HOSPITAL_COMMUNITY): Payer: Self-pay

## 2021-11-27 MED ORDER — ALBUTEROL SULFATE HFA 108 (90 BASE) MCG/ACT IN AERS
1.0000 | INHALATION_SPRAY | Freq: Four times a day (QID) | RESPIRATORY_TRACT | 2 refills | Status: DC | PRN
  Filled 2021-11-27: qty 8.5, 25d supply, fill #0
  Filled 2021-12-07: qty 18, 25d supply, fill #0
  Filled 2021-12-09: qty 6.7, 25d supply, fill #0
  Filled 2021-12-22 – 2021-12-27 (×2): qty 6.7, 25d supply, fill #1

## 2021-11-29 ENCOUNTER — Other Ambulatory Visit (HOSPITAL_COMMUNITY): Payer: Self-pay

## 2021-12-07 ENCOUNTER — Other Ambulatory Visit (HOSPITAL_COMMUNITY): Payer: Self-pay

## 2021-12-09 ENCOUNTER — Other Ambulatory Visit (HOSPITAL_COMMUNITY): Payer: Self-pay

## 2021-12-22 ENCOUNTER — Other Ambulatory Visit: Payer: Self-pay | Admitting: Internal Medicine

## 2021-12-22 ENCOUNTER — Other Ambulatory Visit (HOSPITAL_COMMUNITY): Payer: Self-pay

## 2021-12-23 ENCOUNTER — Other Ambulatory Visit (HOSPITAL_COMMUNITY): Payer: Self-pay

## 2021-12-28 ENCOUNTER — Encounter (HOSPITAL_COMMUNITY): Payer: Self-pay | Admitting: *Deleted

## 2021-12-28 ENCOUNTER — Inpatient Hospital Stay (HOSPITAL_COMMUNITY)
Admission: EM | Admit: 2021-12-28 | Discharge: 2021-12-30 | DRG: 191 | Disposition: A | Discharge status: Home / Self Care | Payer: Commercial Managed Care - HMO | Attending: Internal Medicine | Admitting: Internal Medicine

## 2021-12-28 ENCOUNTER — Other Ambulatory Visit: Payer: Self-pay | Admitting: Internal Medicine

## 2021-12-28 ENCOUNTER — Other Ambulatory Visit (HOSPITAL_COMMUNITY): Payer: Self-pay

## 2021-12-28 ENCOUNTER — Emergency Department (HOSPITAL_COMMUNITY): Payer: Commercial Managed Care - HMO

## 2021-12-28 ENCOUNTER — Other Ambulatory Visit: Payer: Self-pay

## 2021-12-28 DIAGNOSIS — E871 Hypo-osmolality and hyponatremia: Secondary | ICD-10-CM | POA: Diagnosis present

## 2021-12-28 DIAGNOSIS — E872 Acidosis, unspecified: Secondary | ICD-10-CM | POA: Diagnosis present

## 2021-12-28 DIAGNOSIS — Z7951 Long term (current) use of inhaled steroids: Secondary | ICD-10-CM

## 2021-12-28 DIAGNOSIS — R0602 Shortness of breath: Secondary | ICD-10-CM | POA: Diagnosis present

## 2021-12-28 DIAGNOSIS — J439 Emphysema, unspecified: Principal | ICD-10-CM | POA: Diagnosis present

## 2021-12-28 DIAGNOSIS — Z79899 Other long term (current) drug therapy: Secondary | ICD-10-CM | POA: Diagnosis not present

## 2021-12-28 DIAGNOSIS — D751 Secondary polycythemia: Secondary | ICD-10-CM | POA: Diagnosis present

## 2021-12-28 DIAGNOSIS — F17210 Nicotine dependence, cigarettes, uncomplicated: Secondary | ICD-10-CM | POA: Diagnosis present

## 2021-12-28 DIAGNOSIS — I10 Essential (primary) hypertension: Secondary | ICD-10-CM | POA: Diagnosis present

## 2021-12-28 DIAGNOSIS — E876 Hypokalemia: Secondary | ICD-10-CM | POA: Diagnosis present

## 2021-12-28 DIAGNOSIS — E869 Volume depletion, unspecified: Secondary | ICD-10-CM | POA: Diagnosis present

## 2021-12-28 DIAGNOSIS — J441 Chronic obstructive pulmonary disease with (acute) exacerbation: Secondary | ICD-10-CM | POA: Diagnosis not present

## 2021-12-28 DIAGNOSIS — R000 Tachycardia, unspecified: Secondary | ICD-10-CM

## 2021-12-28 DIAGNOSIS — Z20822 Contact with and (suspected) exposure to covid-19: Secondary | ICD-10-CM | POA: Diagnosis present

## 2021-12-28 LAB — CBC
HCT: 56.9 % — ABNORMAL HIGH (ref 39.0–52.0)
HCT: 56.1 % — ABNORMAL HIGH (ref 39.0–52.0)
Hemoglobin: 19 g/dL — ABNORMAL HIGH (ref 13.0–17.0)
Hemoglobin: 19.5 g/dL — ABNORMAL HIGH (ref 13.0–17.0)
MCH: 28.9 pg (ref 26.0–34.0)
MCH: 29.8 pg (ref 26.0–34.0)
MCHC: 33.4 g/dL (ref 30.0–36.0)
MCHC: 34.8 g/dL (ref 30.0–36.0)
MCV: 86.5 fL (ref 80.0–100.0)
MCV: 85.6 fL (ref 80.0–100.0)
Platelets: 178 10*3/uL (ref 150–400)
Platelets: 192 10*3/uL (ref 150–400)
RBC: 6.58 MIL/uL — ABNORMAL HIGH (ref 4.22–5.81)
RBC: 6.55 MIL/uL — ABNORMAL HIGH (ref 4.22–5.81)
RDW: 12.9 % (ref 11.5–15.5)
RDW: 12.7 % (ref 11.5–15.5)
WBC: 7.1 10*3/uL (ref 4.0–10.5)
WBC: 7.5 10*3/uL (ref 4.0–10.5)
nRBC: 0 % (ref 0.0–0.2)
nRBC: 0 % (ref 0.0–0.2)

## 2021-12-28 LAB — BASIC METABOLIC PANEL
Anion gap: 20 — ABNORMAL HIGH (ref 5–15)
Anion gap: 16 — ABNORMAL HIGH (ref 5–15)
BUN: 21 mg/dL — ABNORMAL HIGH (ref 6–20)
BUN: 20 mg/dL (ref 6–20)
CO2: 19 mmol/L — ABNORMAL LOW (ref 22–32)
CO2: 28 mmol/L (ref 22–32)
Calcium: 9 mg/dL (ref 8.9–10.3)
Calcium: 9.1 mg/dL (ref 8.9–10.3)
Chloride: 90 mmol/L — ABNORMAL LOW (ref 98–111)
Chloride: 83 mmol/L — ABNORMAL LOW (ref 98–111)
Creatinine, Ser: 1.1 mg/dL (ref 0.61–1.24)
Creatinine, Ser: 1.22 mg/dL (ref 0.61–1.24)
GFR, Estimated: >60 mL/min (ref >60)
GFR, Estimated: >60 mL/min (ref >60)
Glucose, Bld: 155 mg/dL — ABNORMAL HIGH (ref 70–99)
Glucose, Bld: 168 mg/dL — ABNORMAL HIGH (ref 70–99)
Potassium: 3.9 mmol/L (ref 3.5–5.1)
Potassium: 3.8 mmol/L (ref 3.5–5.1)
Sodium: 129 mmol/L — ABNORMAL LOW (ref 135–145)
Sodium: 127 mmol/L — ABNORMAL LOW (ref 135–145)

## 2021-12-28 LAB — I-STAT ARTERIAL BLOOD GAS, ED
Acid-Base Excess: 4 mmol/L — ABNORMAL HIGH (ref 0.0–2.0)
Bicarbonate: 32.4 mmol/L — ABNORMAL HIGH (ref 20.0–28.0)
Calcium, Ion: 1.15 mmol/L (ref 1.15–1.40)
HCT: 58 % — ABNORMAL HIGH (ref 39.0–52.0)
Hemoglobin: 19.7 g/dL — ABNORMAL HIGH (ref 13.0–17.0)
O2 Saturation: 99 %
Patient temperature: 98.6
Potassium: 3.7 mmol/L (ref 3.5–5.1)
Sodium: 126 mmol/L — ABNORMAL LOW (ref 135–145)
TCO2: 34 mmol/L — ABNORMAL HIGH (ref 22–32)
pCO2 arterial: 61 mmHg — ABNORMAL HIGH (ref 32–48)
pH, Arterial: 7.333 — ABNORMAL LOW (ref 7.35–7.45)
pO2, Arterial: 153 mmHg — ABNORMAL HIGH (ref 83–108)

## 2021-12-28 LAB — SARS CORONAVIRUS 2 BY RT PCR: SARS Coronavirus 2 by RT PCR: NEGATIVE

## 2021-12-28 LAB — TROPONIN I (HIGH SENSITIVITY)
Troponin I (High Sensitivity): 7 ng/L (ref <18)
Troponin I (High Sensitivity): 8 ng/L (ref <18)

## 2021-12-28 LAB — BRAIN NATRIURETIC PEPTIDE: B Natriuretic Peptide: 136.8 pg/mL — ABNORMAL HIGH (ref 0.0–100.0)

## 2021-12-28 MED ORDER — SPIRIVA HANDIHALER 18 MCG IN CAPS
18.0000 ug | ORAL_CAPSULE | Freq: Every day | RESPIRATORY_TRACT | 0 refills | Status: DC
  Filled 2021-12-28: qty 30, 30d supply, fill #0

## 2021-12-28 MED ORDER — AZITHROMYCIN 500 MG IV SOLR: 250.0000 mg | INTRAVENOUS | Status: DC

## 2021-12-28 MED ORDER — IPRATROPIUM BROMIDE 0.02 % IN SOLN
0.5000 mg | Freq: Four times a day (QID) | RESPIRATORY_TRACT | Status: DC
Start: 2021-12-29 — End: 2021-12-29
  Administered 2021-12-29: 0.5 mg via RESPIRATORY_TRACT
  Filled 2021-12-28: qty 2.5

## 2021-12-28 MED ORDER — ACETAMINOPHEN 325 MG PO TABS: 650.0000 mg | ORAL_TABLET | Freq: Four times a day (QID) | ORAL | Status: DC | PRN

## 2021-12-28 MED ORDER — SODIUM CHLORIDE 0.9 % IV SOLN
500.0000 mg | Freq: Once | INTRAVENOUS | Status: AC
  Administered 2021-12-28: 500 mg via INTRAVENOUS
  Filled 2021-12-28: qty 5

## 2021-12-28 MED ORDER — AMLODIPINE BESYLATE 10 MG PO TABS
10.0000 mg | ORAL_TABLET | Freq: Every day | ORAL | Status: DC
  Administered 2021-12-28 – 2021-12-30 (×3): 10 mg via ORAL
  Filled 2021-12-28: qty 2
  Filled 2021-12-28 (×2): qty 1

## 2021-12-28 MED ORDER — ENOXAPARIN SODIUM 40 MG/0.4ML IJ SOSY
40.0000 mg | PREFILLED_SYRINGE | INTRAMUSCULAR | Status: DC
  Administered 2021-12-28 – 2021-12-30 (×3): 40 mg via SUBCUTANEOUS
  Filled 2021-12-28 (×3): qty 0.4

## 2021-12-28 MED ORDER — ACETAMINOPHEN 650 MG RE SUPP: 650.0000 mg | Freq: Four times a day (QID) | RECTAL | Status: DC | PRN

## 2021-12-28 MED ORDER — NICOTINE 21 MG/24HR TD PT24
21.0000 mg | MEDICATED_PATCH | Freq: Every day | TRANSDERMAL | Status: DC
  Administered 2021-12-28 – 2021-12-30 (×3): 21 mg via TRANSDERMAL
  Filled 2021-12-28 (×3): qty 1

## 2021-12-28 MED ORDER — MOMETASONE FURO-FORMOTEROL FUM 200-5 MCG/ACT IN AERO
2.0000 | INHALATION_SPRAY | Freq: Every day | RESPIRATORY_TRACT | Status: DC
  Administered 2021-12-29 – 2021-12-30 (×2): 2 via RESPIRATORY_TRACT
  Filled 2021-12-28: qty 8.8

## 2021-12-28 MED ORDER — AZITHROMYCIN 250 MG PO TABS
500.0000 mg | ORAL_TABLET | Freq: Every day | ORAL | Status: AC
  Administered 2021-12-28 – 2021-12-30 (×3): 500 mg via ORAL
  Filled 2021-12-28 (×3): qty 2

## 2021-12-28 MED ORDER — PREDNISONE 20 MG PO TABS
40.0000 mg | ORAL_TABLET | Freq: Every day | ORAL | Status: DC
  Administered 2021-12-28 – 2021-12-30 (×3): 40 mg via ORAL
  Filled 2021-12-28 (×3): qty 2

## 2021-12-28 MED ORDER — UMECLIDINIUM BROMIDE 62.5 MCG/ACT IN AEPB
1.0000 | INHALATION_SPRAY | Freq: Every day | RESPIRATORY_TRACT | Status: DC
Start: 2021-12-28 — End: 2021-12-29

## 2021-12-28 MED ORDER — ALBUTEROL SULFATE (2.5 MG/3ML) 0.083% IN NEBU
10.0000 mg/h | INHALATION_SOLUTION | RESPIRATORY_TRACT | Status: DC
  Administered 2021-12-28: 10 mg/h via RESPIRATORY_TRACT

## 2021-12-28 MED ORDER — LACTATED RINGERS IV BOLUS
500.0000 mL | Freq: Once | INTRAVENOUS | Status: AC
Start: 2021-12-28 — End: 2021-12-28
  Administered 2021-12-28: 500 mL via INTRAVENOUS

## 2021-12-28 MED ORDER — IPRATROPIUM-ALBUTEROL 0.5-2.5 (3) MG/3ML IN SOLN
3.0000 mL | RESPIRATORY_TRACT | Status: DC
  Administered 2021-12-28 (×2): 3 mL via RESPIRATORY_TRACT
  Filled 2021-12-28 (×2): qty 3

## 2021-12-28 MED ORDER — IPRATROPIUM BROMIDE 0.02 % IN SOLN
0.5000 mg | RESPIRATORY_TRACT | Status: DC
  Administered 2021-12-28 (×2): 0.5 mg via RESPIRATORY_TRACT
  Filled 2021-12-28 (×3): qty 2.5

## 2021-12-28 MED ORDER — SENNOSIDES-DOCUSATE SODIUM 8.6-50 MG PO TABS: 1.0000 | ORAL_TABLET | Freq: Every evening | ORAL | Status: DC | PRN

## 2021-12-28 MED ORDER — HYDROCHLOROTHIAZIDE 25 MG PO TABS
25.0000 mg | ORAL_TABLET | Freq: Every day | ORAL | Status: DC
  Administered 2021-12-28: 25 mg via ORAL
  Filled 2021-12-28: qty 1

## 2021-12-28 NOTE — ED Provider Notes (Signed)
[facility] EMERGENCY DEPARTMENT Provider Note   CSN: [id number] Arrival date & time: 12/28/21  0013     History  Chief Complaint  Patient presents with   Shortness of Breath    Steven [name] is a 51 y.o. male.  The history is provided by the patient and the spouse.  Shortness of Breath Severity:  Severe Onset quality:  Gradual Timing:  Constant Progression:  Worsening Chronicity:  Recurrent Context: smoke exposure   Context: not fumes   Context comment:  Smokes a ppd Relieved by:  Nothing Worsened by:  Nothing Ineffective treatments:  None tried Associated symptoms: wheezing   Associated symptoms: no fever   Risk factors: no hx of PE/DVT       Home Medications Prior to Admission medications   Medication Sig Start Date End Date Taking? Authorizing Provider  acetaminophen (TYLENOL) 500 MG tablet Take 1,000 mg by mouth every 6 (six) hours as needed for moderate pain or headache.   Yes [provider]  albuterol (PROVENTIL) (2.5 MG/3ML) 0.083% nebulizer solution Take 2.5 mg by nebulization every 6 (six) hours as needed for wheezing or shortness of breath.   Yes [provider]  albuterol (VENTOLIN HFA) 108 (90 Base) MCG/ACT inhaler Inhale 1-2 puffs into the lungs every 6 (six) hours as needed for wheezing or shortness of breath. 11/27/21  Yes Adron Bene, MD  cetirizine (ZYRTEC) 10 MG tablet Take 1 tablet (10 mg total) by mouth daily. 04/06/21  Yes Christian, Rylee, MD  fluticasone (FLONASE) 50 MCG/ACT nasal spray Place 1 spray into both nostrils daily. Patient taking differently: Place 1 spray into both nostrils daily as needed for allergies. 04/06/21  Yes Christian, Rylee, MD  hydrochlorothiazide (HYDRODIURIL) 25 MG tablet Take 1 tablet (25 mg total) by mouth daily. 04/20/21  Yes Adron Bene, MD  mometasone-formoterol (DULERA) 200-5 MCG/ACT AERO Inhale 2 puffs into the lungs daily. 07/12/21 12/28/21 Yes Champ Mungo, DO  tiotropium  (SPIRIVA HANDIHALER) 18 MCG inhalation capsule Place 1 capsule (18 mcg total) into inhaler and inhale daily at 6 (six) AM. Patient taking differently: Place 18 mcg into inhaler and inhale daily. 09/18/21  Yes Adron Bene, MD  nicotine polacrilex (NICORETTE) 4 MG gum Take by mouth. Patient not taking: Reported on 12/28/2021 10/12/20   [provider]  predniSONE (DELTASONE) 20 MG tablet Take 3 tablets (60mg ) by mouth daily for the first 5 days, followed by 2 tablets daily for three days, followed by 1 tablet daily for two days. Patient not taking: Reported on 12/28/2021 04/08/21   06/08/21, MD  varenicline (CHANTIX) 0.5 MG tablet Take 1 tablet (0.5 mg total) by mouth 2 (two) times daily. Patient not taking: Reported on 12/28/2021 04/20/21   04/22/21, MD      Allergies    Patient has no known allergies.    Review of Systems   Review of Systems  Unable to perform ROS: Acuity of condition  Constitutional:  Negative for fever.  HENT:  Negative for facial swelling.   Eyes:  Negative for redness.  Respiratory:  Positive for shortness of breath and wheezing.   Cardiovascular:  Negative for leg swelling.   Physical Exam Updated Vital Signs BP (!) 152/106   Pulse (!) 116   Temp (!) 97.2 F (36.2 C) (Temporal)   Resp (!) 31   SpO2 98%  Physical Exam Vitals and nursing note reviewed. Exam conducted with a chaperone present.  Constitutional:      Appearance: Normal appearance.  He is not diaphoretic.  HENT:     Head: Normocephalic and atraumatic.     Nose: Nose normal.  Eyes:     Conjunctiva/sclera: Conjunctivae normal.     Pupils: Pupils are equal, round, and reactive to light.  Cardiovascular:     Rate and Rhythm: Regular rhythm. Tachycardia present.     Pulses: Normal pulses.     Heart sounds: Normal heart sounds.  Pulmonary:     Effort: Tachypnea and prolonged expiration present.     Breath sounds: Wheezing present. No rales.  Abdominal:     General:  Bowel sounds are normal.     Palpations: Abdomen is soft.     Tenderness: There is no abdominal tenderness. There is no guarding.  Musculoskeletal:        General: Normal range of motion.     Cervical back: Normal range of motion and neck supple.     Right lower leg: No edema.     Left lower leg: No edema.  Skin:    General: Skin is warm and dry.     Capillary Refill: Capillary refill takes less than 2 seconds.  Neurological:     General: No focal deficit present.     Mental Status: He is alert and oriented to person, place, and time.     Deep Tendon Reflexes: Reflexes normal.  Psychiatric:        Mood and Affect: Mood normal.        Behavior: Behavior normal.    ED Results / Procedures / Treatments   Labs (all labs ordered are listed, but only abnormal results are displayed) Results for orders placed or performed during the hospital encounter of 12/28/21  SARS Coronavirus 2 by RT PCR (hospital order, [facility]) *cepheid single result test* Anterior Nasal Swab   Specimen: Anterior Nasal Swab  Result Value Ref Range   SARS Coronavirus 2 by RT PCR NEGATIVE NEGATIVE  CBC  Result Value Ref Range   WBC 7.1 4.0 - 10.5 K/uL   RBC 6.58 (H) 4.22 - 5.81 MIL/uL   Hemoglobin 19.0 (H) 13.0 - 17.0 g/dL   HCT 96.056.9 (H) 45.439.0 - 09.852.0 %   MCV 86.5 80.0 - 100.0 fL   MCH 28.9 26.0 - 34.0 pg   MCHC 33.4 30.0 - 36.0 g/dL   RDW 11.912.9 14.711.5 - 82.915.5 %   Platelets 178 150 - 400 K/uL   nRBC 0.0 0.0 - 0.2 %   DG Chest Portable 1 View  Result Date: 12/28/2021 CLINICAL DATA:  Shortness of breath, COPD EXAM: PORTABLE CHEST 1 VIEW COMPARISON:  03/02/2021 FINDINGS: Severe bullous emphysema again noted. No confluent airspace opacities or effusions. Heart is normal size. No acute bony abnormality. IMPRESSION: Severe bullous emphysema. No active disease. Electronically Signed   By: Charlett NoseKevin  Dover M.D.   On: 12/28/2021 00:47     EKG EKG Interpretation  Date/Time:  Tuesday Dec 28 2021  00:23:18 EDT Ventricular Rate:  115 PR Interval:  134 QRS Duration: 100 QT Interval:  342 QTC Calculation: 473 R Axis:   69 Text Interpretation: Sinus tachycardia RSR' in V1 or V2, right VCD or RVH Confirmed by Nicanor AlconPalumbo, Velton Roselle ([id number]) on 12/28/2021 1:27:59 AM  Radiology DG Chest Portable 1 View  Result Date: 12/28/2021 CLINICAL DATA:  Shortness of breath, COPD EXAM: PORTABLE CHEST 1 VIEW COMPARISON:  03/02/2021 FINDINGS: Severe bullous emphysema again noted. No confluent airspace opacities or effusions. Heart is normal size. No acute bony abnormality.  IMPRESSION: Severe bullous emphysema. No active disease. Electronically Signed   By: Charlett Nose M.D.   On: 12/28/2021 00:47    Procedures Procedures    Medications Ordered in ED Medications  albuterol (PROVENTIL) (2.5 MG/3ML) 0.083% nebulizer solution (10 mg/hr Nebulization New Bag/Given 12/28/21 0034)    ED Course/ Medical Decision Making/ A&P                           Medical Decision Making Patient with COPD with wheezing and SOB on home nebulizer and inhalers still smokes 1 PPD down from 2 PPD  Amount and/or Complexity of Data Reviewed Independent Historian: spouse and EMS    Details: see above External Data Reviewed: notes.    Details: previous notes reviewed Labs: ordered.    Details: all labs reviewed by me: negative covid, normal white count 7.1 hemoconcentrated 19 Radiology: ordered. Discussion of management or test interpretation with external provider(s): Internal medicine resident   Risk Prescription drug management. Decision regarding hospitalization. Risk Details: BIPAP initiated by me in the ED.  Nebs continue.  Needs admission to stepdown.    Critical Care Total time providing critical care: 60 minutes (Bipap consults and ongoing treatments )   Final Clinical Impression(s) / ED Diagnoses Final diagnoses:  COPD exacerbation (HCC)   The patient appears reasonably stabilized for admission considering the  current resources, flow, and capabilities available in the ED at this time, and I doubt any other [facility] requiring further screening and/or treatment in the ED prior to admission.  Rx / DC Orders ED Discharge Orders     None         Macil Crady, MD 12/28/21 0623

## 2021-12-28 NOTE — ED Notes (Signed)
MD at bedside and took pt off oxygen completely.  No new orders at this time.

## 2021-12-28 NOTE — Hospital Course (Signed)
States doing well. Feeling better as breathing has improved.

## 2021-12-28 NOTE — Progress Notes (Signed)
RT removed patient from BIPAP and titrated to 3L nasal Canula. Vital signs stable at this time. Bedside RN aware.

## 2021-12-28 NOTE — H&P (Addendum)
Date: 12/28/2021               Patient Name:  Steven [name] MRN: [id number]  DOB: 1970/12/03 Age / Sex: 51 y.o., male   PCP: Pcp, No         Medical Service: Internal Medicine Teaching Service         Attending Physician: Dr. Mercie Eon, MD    First Contact: Carmel Sacramento, MD Pager: MP [phone]  Second Contact: Sharrell Ku, MD Pager: PA [phone]       After Hours (After 5p/  First Contact Pager: [phone]  weekends / holidays): Second Contact Pager: [phone]   SUBJECTIVE  Chief Complaint: shortness of breath  History of Present Illness: Steven [name] is a 51 y.o. male with a pertinent PMH of COPD (with no previous PFTs and not on supplemental O2), tobacco use disorder, and hypertension, who presents to [facility] with shortness of breath. History obtained by the patient and his fiance at bedside  The patient's fiance states that he has been more short of breath than usual over the last 3 days, including at rest. He has had an increased productive cough of white phlegm and the patient reports feeling more "tight". The patient uses spiriva, dulera, and albuterol at home and has been using these regularly. He has been using his rescue inhaler more frequently over the last few days without any significant relief. The patient believes that his current episode is due to weather changes, as this is usually the trigger for his COPD exacerbations. The patient denies any fevers, but does endorse chills. He also denies any headache, dizziness, lightheadedness, chest pain, palpitations, abd pain, n/v/d, hematochezia, melena, dysuria, or hematuria. At this time, the patient states that his breathing already feels improved compared to earlier today, however, he is not at his baseline. Of note, the patient had ~3 COPD exacerbations in 2022 and this appears to be his first in 2023.    In the ED, the patient was tachycardic in the 110s and tachypneic in the 20-30s. Prior to arrival, the patient was given  duonebs x2 at home and 125 mg solumedrol vs EMS. CXR showed severe bullous emphysema, no significant change compared to prior CXR from 03/2021. CBC without leukocytosis.   Medications: No current facility-administered medications on file prior to encounter.   Current Outpatient Medications on File Prior to Encounter  Medication Sig Dispense Refill   acetaminophen (TYLENOL) 500 MG tablet Take 1,000 mg by mouth every 6 (six) hours as needed for moderate pain or headache.     albuterol (PROVENTIL) (2.5 MG/3ML) 0.083% nebulizer solution Take 2.5 mg by nebulization every 6 (six) hours as needed for wheezing or shortness of breath.     albuterol (VENTOLIN HFA) 108 (90 Base) MCG/ACT inhaler Inhale 1-2 puffs into the lungs every 6 (six) hours as needed for wheezing or shortness of breath. 6.7 g 2   cetirizine (ZYRTEC) 10 MG tablet Take 1 tablet (10 mg total) by mouth daily. 90 tablet 0   fluticasone (FLONASE) 50 MCG/ACT nasal spray Place 1 spray into both nostrils daily. (Patient taking differently: Place 1 spray into both nostrils daily as needed for allergies.) 16 g 2   hydrochlorothiazide (HYDRODIURIL) 25 MG tablet Take 1 tablet (25 mg total) by mouth daily. 30 tablet 11   mometasone-formoterol (DULERA) 200-5 MCG/ACT AERO Inhale 2 puffs into the lungs daily. 13 g 2   tiotropium (SPIRIVA HANDIHALER) 18 MCG inhalation capsule Place 1 capsule (18 mcg total) into inhaler  and inhale daily at 6 (six) AM. (Patient taking differently: Place 18 mcg into inhaler and inhale daily.) 30 capsule 2   nicotine polacrilex (NICORETTE) 4 MG gum Take by mouth. (Patient not taking: Reported on 12/28/2021)     predniSONE (DELTASONE) 20 MG tablet Take 3 tablets (60mg ) by mouth daily for the first 5 days, followed by 2 tablets daily for three days, followed by 1 tablet daily for two days. (Patient not taking: Reported on 12/28/2021) 23 tablet 0   varenicline (CHANTIX) 0.5 MG tablet Take 1 tablet (0.5 mg total) by mouth 2 (two)  times daily. (Patient not taking: Reported on 12/28/2021) 30 tablet 3    Past Medical History:  Past Medical History:  Diagnosis Date   Asthma    COPD (chronic obstructive pulmonary disease) (HCC)    Hypertension     Social:  Patient lives at home with fiance. 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  He is independent in ADLs and iADLs.  Current tobacco use 1-1.5 ppd (previously up to 2 ppd). Endorses current alcohol use - 2 beers per day, possibly more. Last drink was last week. Denies any other illicit substance use.  PCP - Dr Adron BeneGreylon Gawaluck  Family History: Diabetes - paternal side Throat cancer - grandmother  Denies family history of COPD, heart disease  Allergies: Allergies as of 12/28/2021   (No Known Allergies)    Review of Systems: A complete ROS was negative except as per HPI.   OBJECTIVE:  Physical Exam: Blood pressure (!) 149/103, pulse (!) 115, temperature (!) 97.2 F (36.2 C), temperature source Temporal, resp. rate (!) 24, SpO2 100 %. General:  No acute distress, although breathing appears labored.  Head: Normocephalic. Atraumatic. CV: Tachycardic rate, regular rhythm. No murmurs, rubs, or gallops. No LE edema Pulmonary: Tachypnea. Increased work of breathing on BiPAP.  Significant wheezing appreciated in all lung fields with prolonged expiration.  Abdominal: Soft, nontender, nondistended. Normal bowel sounds. Extremities: Palpable radial and DP pulses. Normal ROM. Skin: Warm and dry. No obvious rash or lesions. Neuro: A&Ox3. Moves all extremities. Normal sensation. No focal deficit. Psych: Normal mood and affect   Pertinent Labs: CBC    Component Value Date/Time   WBC 7.1 12/28/2021 0100   RBC 6.58 (H) 12/28/2021 0100   HGB 19.0 (H) 12/28/2021 0100   HCT 56.9 (H) 12/28/2021 0100   PLT 178 12/28/2021 0100   MCV 86.5 12/28/2021 0100   MCH 28.9 12/28/2021 0100   MCHC 33.4 12/28/2021 0100    RDW 12.9 12/28/2021 0100   LYMPHSABS 2.1 03/02/2021 1253   MONOABS 0.9 03/02/2021 1253   EOSABS 0.3 03/02/2021 1253   BASOSABS 0.1 03/02/2021 1253     CMP     Component Value Date/Time   NA 134 (L) 03/03/2021 0438   K 5.3 (H) 03/03/2021 0438   CL 100 03/03/2021 0438   CO2 22 03/03/2021 0438   GLUCOSE 158 (H) 03/03/2021 0438   BUN 9 03/03/2021 0438   CREATININE 0.91 03/03/2021 0438   CALCIUM 8.9 03/03/2021 0438   GFRNONAA >60 03/03/2021 0438    Pertinent Imaging: DG Chest Portable 1 View  Result Date: 12/28/2021 CLINICAL DATA:  Shortness of breath, COPD EXAM: PORTABLE CHEST 1 VIEW COMPARISON:  03/02/2021 FINDINGS: Severe bullous emphysema again noted. No confluent airspace opacities or effusions. Heart is normal size. No acute bony abnormality. IMPRESSION: Severe bullous emphysema. No active disease. Electronically Signed   By: Caryn BeeKevin  Dover M.D.   On: 12/28/2021 00:47    EKG: personally reviewed my interpretation is unchanged from previous tracings, sinus tachycardia  ASSESSMENT & PLAN:  Assessment: Principal Problem:   COPD exacerbation (HCC)   Desman Polak is a 51 y.o. with pertinent PMH of COPD, tobacco use disorder, and hypertension who presented with shortness of breath and admit for COPD exacerbation on hospital day 0  Plan: #COPD exacerbation #Severe bullous emphysema emphysema Patient with history of severe bullous emphysema (although no formal PFTs) and tobacco use disorder presented with 3 days of increased dyspnea and productive cough which has not improved with home nebulizer or rescue inhaler. Patient received 125 mg solumedrol and duonebs x2 via EMS and was placed on BiPAP in the ED. ABG with pH of 7.33, bicarb 32.4 and pCO2 elevated to 61. At this time, patient is afebrile with no leukocytosis and no signs of pneumonia on CXR. CXR showed severe bullous emphysema.  - Wean BiPAP as able; maintain SpO2 88-92%  - Resume home Spiriva and Dulera  - Duoneb q4h prn   - Prednisone 40 mg bid x 5 days  - Azithromycin 500 mg  - Consider pulmonary consult in AM; patient will need formal PFTs after discharge   #Tobacco use disorder Patient smokes about 1-1.5 ppd, previously smoking up to 2 ppd. He was prescribed chantix, however, he states that he does not take this anymore because it made him sick.  - Daily nicotine patch   #Polycythemia Hb elevated to 19 today. Likely secondary to tobacco use disorder and chronic hypoxia. - Daily CBC  #Anion gap metabolic acidosis BMP with elevated anion gap to 20 and bicarb low to 19. Unclear cause of the patient's AGMA at this time. Patient does not have a history of diabetes and glucose level is 155, thus low suspicion for DKA. Renal function with within normal limits, so RTA is also not likely. No history of ingested substances.  - Lactic acid level pending  #Hyponatremia Sodium level is low at 129 (compared to 134 in August). The patient appears slightly dry/volume depleted on exam. - 500 cc LR bolus - Daily BMP  #Hypertension Patient is on HCTZ 25 mg daily at home. SBP in the 150s in the ED. - Resume home HCTZ 25 mg daily    Best Practice: Diet: NPO until patient off BiPAP IVF: Fluids: none VTE: enoxaparin (LOVENOX) injection 40 mg Start: 12/28/21 0200 consider switch to xarelto once off BiPAP Code: Full AB: Azithromycin  Status: Observation with expected length of stay less than 2 midnights. Anticipated Discharge Location: Home Barriers to Discharge: Medical stability  Signature: Elza Rafter, DO Internal Medicine Resident, PGY-1 [facility]  Pager: [phone] 2:00 AM, 12/28/2021   Please contact the on call pager after 5 pm and on weekends at [phone].

## 2021-12-28 NOTE — Telephone Encounter (Signed)
Can you figure out who his PCP is? It looks like he has seen [place] a few times but are not listed as PCP. Will refill a 30d supply.

## 2021-12-28 NOTE — Progress Notes (Signed)
Subjective: No acute overnight events.   Patient was seen at bedside during rounds today. He reports feeling much improved compared to admission. Reports breathing much better. Improved cough. He denies chest pain, SHOB, fevers, or chills.   Fiance is at bedside. She reports that he appears much improved since coming to hospital. She notes improvement in symptoms. She also reports that pt should have insurance coverage within the next couple of days.   Pt is updated on the plan for today, and all questions and concerns are addressed.   Objective:  Vital signs in last 24 hours: Vitals:   12/28/21 1215 12/28/21 1230 12/28/21 1245 12/28/21 1300  BP: (!) 148/109 (!) 151/104 (!) 146/111 (!) 139/115  Pulse: 99 (!) 103 (!) 101 98  Resp: 15 16 15 17   Temp:      TempSrc:      SpO2: 100% 99% 99% 100%   Constitutional: alert, well-appearing, in NAD Cardiovascular: tachycardic, RR, no m/r/g, non-edematous bilateral LE Pulmonary/Chest: normal work of breathing on 3L and ongoing non-labored breathing after turning down to 2L O2. Improved breath sounds compared to admission, with mild expiratory wheezing.  Abdominal: soft, non-tender to palpation, non-distended MSK: normal bulk and tone Neurological: A&O x 3, follows commands. Skin: warm and dry Psych: normal behavior, normal affect   Assessment/Plan:  Principal Problem:   COPD exacerbation (HCC) Active Problems:   Primary hypertension  Steven [name] is a 51 y.o. with pertinent PMH of COPD, tobacco use disorder, and hypertension admitted for COPD exacerbation.   COPD exacerbation, improving  Severe bullous emphysema emphysema Transitioned from bipap to 3L  this morning. Was doing well on RA, but RN re-started supplemental O2 for complaints of SHOB. Currently doing well on 2L. Reports overall improvement in symptoms, with no ongoing cough. He has good understanding of his inhalers, and reports appropriate use. No concern for infection  at this time.  - Wean off of supplemental O2 as tolerated, goal SpO2 88-92%  - Continue home home LAMA/LABA/ICS -- Spiriva and Dulera  - Duoneb q4h changed to ipratropium q4h alone in setting of ongoing tachycardia  - Prednisone 40 mg bid x 5 days, last dose 6/3 - Azithromycin 500 mg, last dose 6/2/ - Pt reports that he will have insurance coverage within the next several days; this will be very beneficial for outpatient pulmonary follow up and PFTs.    Tobacco use disorder - Daily nicotine patch    Polycythemia Hb 19. Likely secondary to tobacco use disorder and chronic hypoxia, given chronically elevated.  - Trend CBC - Can consider further work up with EPO in the OP setting    AGMA AG 20 with bicarb low at 19 on admission. Improved AG to 16 and bicarb to 28.  - AM BMP    Hyponatremia Na 129 -> 126 -> 127 despite receiving 1/2L of IVF. He appears euvolemic on exam. He is currently asymptomatic.  - Already received HCTZ today, but have dc'd in the setting of hyponatremia  - Trend BMP   Hypertension Improved SBP from 150 -> 130's. DBP elevated in 110's. Only received a total of 1/2L of IVF.  Asymptomatic at this time. Denies headaches, vision changes, or confusion.  - Discontinued home HCTZ 25 mg as outlined above (received today's dose) - Started Amlodipine 10 mg daily  - Will consider adding another agent tmrw if persistently elevated     Best Practice: Diet: Normal IVF: None VTE: Enoxaparin Code: Full   10-14-1975, MD  Internal Medicine Resident, PGY-1 Pager: [phone] After 5pm on weekdays and 1pm on weekends: On Call pager [phone]

## 2021-12-28 NOTE — ED Triage Notes (Signed)
Pt from home for sob, hx of COPD. Given duonebs x 2 at home, pt initally wanting to stay home after treatments but breathing was still labored. Productive cough, EMS also gave 125mg  Solumedrol, 2g Mag. Resp rate 40, 18 g R arm

## 2021-12-28 NOTE — Telephone Encounter (Signed)
Per chart, pt is currently in the ER.   

## 2021-12-29 DIAGNOSIS — J441 Chronic obstructive pulmonary disease with (acute) exacerbation: Secondary | ICD-10-CM | POA: Diagnosis not present

## 2021-12-29 DIAGNOSIS — E871 Hypo-osmolality and hyponatremia: Secondary | ICD-10-CM | POA: Diagnosis not present

## 2021-12-29 DIAGNOSIS — I10 Essential (primary) hypertension: Secondary | ICD-10-CM | POA: Diagnosis not present

## 2021-12-29 DIAGNOSIS — F17210 Nicotine dependence, cigarettes, uncomplicated: Secondary | ICD-10-CM | POA: Diagnosis not present

## 2021-12-29 LAB — BASIC METABOLIC PANEL
Anion gap: 9 (ref 5–15)
BUN: 18 mg/dL (ref 6–20)
CO2: 34 mmol/L — ABNORMAL HIGH (ref 22–32)
Calcium: 9.1 mg/dL (ref 8.9–10.3)
Chloride: 88 mmol/L — ABNORMAL LOW (ref 98–111)
Creatinine, Ser: 0.99 mg/dL (ref 0.61–1.24)
GFR, Estimated: >60 mL/min (ref >60)
Glucose, Bld: 112 mg/dL — ABNORMAL HIGH (ref 70–99)
Potassium: 3.5 mmol/L (ref 3.5–5.1)
Sodium: 131 mmol/L — ABNORMAL LOW (ref 135–145)

## 2021-12-29 LAB — CBC
HCT: 51.5 % (ref 39.0–52.0)
Hemoglobin: 17.9 g/dL — ABNORMAL HIGH (ref 13.0–17.0)
MCH: 29.2 pg (ref 26.0–34.0)
MCHC: 34.8 g/dL (ref 30.0–36.0)
MCV: 84 fL (ref 80.0–100.0)
Platelets: 186 10*3/uL (ref 150–400)
RBC: 6.13 MIL/uL — ABNORMAL HIGH (ref 4.22–5.81)
RDW: 12.3 % (ref 11.5–15.5)
WBC: 9.3 10*3/uL (ref 4.0–10.5)
nRBC: 0 % (ref 0.0–0.2)

## 2021-12-29 MED ORDER — ALBUTEROL SULFATE (2.5 MG/3ML) 0.083% IN NEBU
2.5000 mg | INHALATION_SOLUTION | Freq: Four times a day (QID) | RESPIRATORY_TRACT | Status: DC
Start: 2021-12-29 — End: 2021-12-29
  Administered 2021-12-29: 2.5 mg via RESPIRATORY_TRACT
  Filled 2021-12-29: qty 3

## 2021-12-29 MED ORDER — UMECLIDINIUM BROMIDE 62.5 MCG/ACT IN AEPB
1.0000 | INHALATION_SPRAY | Freq: Every day | RESPIRATORY_TRACT | Status: DC
  Administered 2021-12-29: 1 via RESPIRATORY_TRACT
  Filled 2021-12-29 (×2): qty 7

## 2021-12-29 MED ORDER — IPRATROPIUM BROMIDE 0.02 % IN SOLN
0.5000 mg | Freq: Four times a day (QID) | RESPIRATORY_TRACT | Status: DC
  Administered 2021-12-29: 0.5 mg via RESPIRATORY_TRACT
  Filled 2021-12-29: qty 2.5

## 2021-12-29 MED ORDER — IPRATROPIUM BROMIDE 0.02 % IN SOLN: 0.5000 mg | Freq: Four times a day (QID) | RESPIRATORY_TRACT | Status: DC | PRN

## 2021-12-29 MED ORDER — IPRATROPIUM BROMIDE 0.02 % IN SOLN
0.5000 mg | Freq: Four times a day (QID) | RESPIRATORY_TRACT | Status: DC
  Administered 2021-12-30: 0.5 mg via RESPIRATORY_TRACT
  Filled 2021-12-29: qty 2.5

## 2021-12-29 MED ORDER — ALBUTEROL SULFATE (2.5 MG/3ML) 0.083% IN NEBU: 2.5000 mg | INHALATION_SOLUTION | Freq: Four times a day (QID) | RESPIRATORY_TRACT | Status: DC | PRN

## 2021-12-29 NOTE — Plan of Care (Signed)
  Problem: Respiratory: Goal: Levels of oxygenation will improve Outcome: Progressing   Problem: Clinical Measurements: Goal: Respiratory complications will improve Outcome: Progressing

## 2021-12-29 NOTE — Discharge Instructions (Addendum)
You were admitted for a COPD exacerbation. We treated you by giving you breathing treatments, antibiotic, prednisone/steroids, and by giving you oxygen. You improved greatly with this. You have two new inhalers called Advair and Ipratropium; please be sure to pick those use those as instructed. Please complete your course of prednisone.   Congratulations on making the decision to quit smoking! It is very important that you continue with this goal . Please take Chantix and Buproprion as instructed, and continue to use nicotine patches.   As for your high blood pressure: stop taking hydrochlorothiazide. Start taking Amlodipine and Losartan. Monitor your blood pressure at home, write it down and bring during your PCP appointment.   Please follow up in the internal medicine clinic in the basement of the hospital. You will need a referral to see pulmonology once you see your PCP in the clinic. Please do not forget to follow up in clinic.

## 2021-12-29 NOTE — Plan of Care (Signed)
?  Problem: Respiratory: ?Goal: Levels of oxygenation will improve ?Outcome: Progressing ?Goal: Ability to maintain adequate ventilation will improve ?Outcome: Progressing ?  ?

## 2021-12-29 NOTE — Progress Notes (Signed)
   12/29/21 1201  Assess: MEWS Score  Temp 98 F (36.7 C)  BP (!) 122/97  MAP (mmHg) 106  Pulse Rate (!) 121  ECG Heart Rate (!) 122  Resp 18  SpO2 95 %  Assess: MEWS Score  MEWS Temp 0  MEWS Systolic 0  MEWS Pulse 2  MEWS RR 0  MEWS LOC 0  MEWS Score 2  MEWS Score Color Yellow  Assess: if the MEWS score is Yellow or Red  Were vital signs taken at a resting state? Yes  Focused Assessment No change from prior assessment  Does the patient meet 2 or more of the SIRS criteria? No  MEWS guidelines implemented *See Row Information* Yes  Treat  Pain Scale 0-10  Pain Score 0  Take Vital Signs  Increase Vital Sign Frequency  Yellow: Q 2hr X 2 then Q 4hr X 2, if remains yellow, continue Q 4hrs  Notify: Charge Nurse/RN  Name of Charge Nurse/RN Notified Londria, RN  Date Charge Nurse/RN Notified 12/29/21  Time Charge Nurse/RN Notified 1200  Notify: Provider  Provider Name/Title Posey Pronto. M  Date Provider Notified 12/29/21  Time Provider Notified 1200  Method of Notification Page  Notification Reason Other (Comment) (increased heart rate)  Provider response No new orders  Date of Provider Response 12/29/21  Time of Provider Response 1201  Assess: SIRS CRITERIA  SIRS Temperature  0  SIRS Pulse 1  SIRS Respirations  0  SIRS WBC 0  SIRS Score Sum  1

## 2021-12-29 NOTE — Progress Notes (Signed)
SATURATION QUALIFICATIONS: (This note is used to comply with regulatory documentation for home oxygen)  Patient Saturations on Room Air at Rest = 94%  Patient Saturations on Room Air while Ambulating = 87%  Patient Saturations on 2 Liters of oxygen while Ambulating = 99%  Please briefly explain why patient needs home oxygen: n/a

## 2021-12-29 NOTE — Discharge Summary (Incomplete)
Name: Steven [name] MRN: [id number] DOB: 1970/10/16 51 y.o. PCP: Pcp, No  Date of Admission: 12/28/2021 12:13 AM Date of Discharge:  12/29/21 Attending Physician: Dr. Lafonda Mosses  DISCHARGE DIAGNOSIS:  Primary Problem: COPD exacerbation [facility])   Hospital Problems: Principal Problem:   COPD exacerbation (HCC) Active Problems:   Primary hypertension    DISCHARGE MEDICATIONS:   Allergies as of 12/29/2021   No Known Allergies   Med Rec must be completed prior to using this [facility]***        Durable Medical Equipment  (From admission, onward)           Start     Ordered   12/29/21 1344  For home use only DME oxygen  Once       Question Answer Comment  Length of Need 6 Months   Mode or (Route) Nasal cannula   Liters per Minute 2   Oxygen delivery system Gas      12/29/21 1358            DISPOSITION AND FOLLOW-UP:  Mr.Akil Hyacinth Meeker was discharged from [facility] in stable condition. At the hospital follow up visit please address:  Follow-up Recommendations: Consults: Pulmonology Labs: CBC, BMP  Studies: PFT testing Medications: Complete Azithromycin *** and Prednisone course. Continued home inhalers. Discontinued HCTZ given hyponatremia; started Amlodipine 10 mg qd and Losartan 25 mg daily.   Follow-up Appointments:  Follow-up Information     [facility]. Go in 8 day(s).   Why: Please go to your appointment on 01/06/22 at 1:45 pm to the internal medicine center for a post-hospital follow up appointment. Contact information: [address] [place] [address] [phone]                HOSPITAL COURSE:  Patient Summary: COPD exacerbation, improving  Severe bullous emphysema emphysema Patient with history of severe bullous emphysema (no formal PFTs) and tobacco use disorder presented with 3 days of increased dyspnea and productive cough which has not improved with home nebulizer or rescue  inhaler. Patient received 125 mg solumedrol and duonebs x2 via EMS and was placed on BiPAP in the ED. ABG with pH of 7.33, bicarb 32.4 and pCO2 elevated to 61.He was afebrile and without leukocytosis, and no signs of pneumonia on CXR. CXR showed severe bullous emphysema. He was started on Duonebs, azithromycin (500 mg 3 day course) and prednisone (5 day course). Continued home LAMA/LABA/ICS -- Spiriva and Dulera. Within 8 hours, he improved significantly and was off of Bipap, satting >90% on room air. Exam notable for mild end-expiratory wheezes most prominent in lower lung fields, but much improved compared to admission. Pt reports that he will have insurance coverage within the next several days; this will be very beneficial for outpatient pulmonary follow up and PFTs. He will need pulmonology referral for PFTs at follow up.   Tachycardia, improved Improved from 120's to 100's with changing Duonebs to ipratropium alone. Suspect this was 2/2 sympathic activation from albuterol use.    Hypertension Improved SBP from 150 -> 130's. DBP elevated in 110's. Only received a total of 1/2L of IVF.  Asymptomatic; denies headaches, vision changes, or confusion. BP improved to 130-140's/90's.  - Discontinued home HCTZ 25 mg as outlined above (received today's dose) - Started Amlodipine 10 mg daily  - Started Losartan 25 mg daily at time of discharge. Cr ~1. Please obtain BMP to check renal function at hospital follow up.   Tobacco use  disorder  Daily nicotine patch was ordered and he was using. Previously prescribed chantix, but reports that he was not using. ***    Polycythemia Hgb 19.5 (admission) -> 17.9 (discharge). Likely secondary to tobacco use disorder and chronic hypoxia, given chronically elevated. Improved with treatment. Can consider further work up with EPO in the OP setting.   AGMA, resolved  AG 20 with bicarb low at 19 on admission. Improved AG to 16 and then 9, and bicarb back to normal.  Obtain BMP at hospital follow up appt.    Hyponatremia Hyponatremic 129 -> 127 on admission. Euvolemic and asymptomatic. Dc'd HCTZ and Na improved to 131. - Dc'd HCTZ. - Trend BMP   DISCHARGE INSTRUCTIONS:    SUBJECTIVE:  No acute overnight events.    Patient was seen at bedside during rounds today. He reports feeling much improved compared to admission. Reports breathing much better. Improved cough. He denies chest pain, SHOB, fevers, or chills. He reports wanting to quit tobacco. Reports good understanding of his medications and inhalers, and states that someone has already showed him how to use his inhalers.    Fiance is at bedside. She reports that he appears much improved since coming to hospital. She notes improvement in symptoms, but states that he gets nervous our during rounds.    Pt is ready for discharge, and he is agreeable.   Discharge Vitals:   BP (!) 122/97 (BP Location: Left Arm)   Pulse (!) 121   Temp 98 F (36.7 C) (Oral)   Resp 18   Ht 5\' 10"  (1.778 m)   Wt 75.8 kg Comment: scale c  SpO2 95%   BMI 23.99 kg/m   OBJECTIVE:  Constitutional: alert, well-appearing, in NAD Cardiovascular: tachycardic, RR, no m/r/g, non-edematous bilateral LE Pulmonary/Chest: normal work of breathing on 3L and ongoing non-labored breathing after turning down to 2L O2. Improved breath sounds compared to admission, with mild expiratory wheezing in lower lung fields. *** Abdominal: soft, non-tender to palpation, non-distended MSK: normal bulk and tone Neurological: A&O x 3, follows commands. Skin: warm and dry Psych: normal behavior, normal affect  Pertinent Labs, Studies, and Procedures:     Latest Ref Rng & Units 12/29/2021    3:35 AM 12/28/2021    2:47 AM 12/28/2021    2:17 AM  CBC  WBC 4.0 - 10.5 K/uL 9.3   7.5     Hemoglobin 13.0 - 17.0 g/dL 12/30/2021   78.5   88.5    Hematocrit 39.0 - 52.0 % 51.5   56.1   58.0    Platelets 150 - 400 K/uL 186   192          Latest Ref Rng  & Units 12/29/2021    3:35 AM 12/28/2021    2:47 AM 12/28/2021    2:17 AM  CMP  Glucose 70 - 99 mg/dL 12/30/2021   741     BUN 6 - 20 mg/dL 18   20     Creatinine 0.61 - 1.24 mg/dL 287   8.67     Sodium 135 - 145 mmol/L 131   127   126    Potassium 3.5 - 5.1 mmol/L 3.5   3.8   3.7    Chloride 98 - 111 mmol/L 88   83     CO2 22 - 32 mmol/L 34   28     Calcium 8.9 - 10.3 mg/dL 9.1   9.1       DG Chest Portable  1 View  Result Date: 12/28/2021 CLINICAL DATA:  Shortness of breath, COPD EXAM: PORTABLE CHEST 1 VIEW COMPARISON:  03/02/2021 FINDINGS: Severe bullous emphysema again noted. No confluent airspace opacities or effusions. Heart is normal size. No acute bony abnormality. IMPRESSION: Severe bullous emphysema. No active disease. Electronically Signed   By: Charlett NoseKevin  Dover M.D.   On: 12/28/2021 00:47      Carmel SacramentoMonik Kimela Malstrom, MD Internal Medicine Resident, PGY-1 Pager: [phone]

## 2021-12-29 NOTE — Progress Notes (Addendum)
Subjective: No acute overnight events.    Patient was seen at bedside during rounds today. He reports feeling much improved compared to admission. Reports breathing much better. Improved cough. He denies chest pain, SHOB, fevers, or chills. He reports wanting to quit tobacco. Reports good understanding of his medications and inhalers, and states that someone has already showed him how to use his inhalers.    Fiance is at bedside. She reports that he appears much improved since coming to hospital. She notes improvement in symptoms.   Pt is updated on the plan for today, and all questions and concerns are addressed.   Objective:  Vital signs in last 24 hours: Vitals:   12/29/21 0418 12/29/21 0730 12/29/21 0823 12/29/21 1201  BP: (!) 149/96 (!) 143/98  (!) 122/97  Pulse: (!) 106   (!) 121  Resp: 18 20  18   Temp: 98 F (36.7 C) 97.7 F (36.5 C)  98 F (36.7 C)  TempSrc: Oral Oral  Oral  SpO2: 98% 96% 99% 95%  Weight:      Height:       Constitutional: alert, well-appearing, in NAD Cardiovascular: tachycardic, RR, no m/r/g, non-edematous bilateral LE Pulmonary/Chest: normal work of breathing on 2L. Non-labored breathing after turning off supplemental O2. Improved breath sounds compared to admission, with some mild ongoing expiratory wheezing in lower lung fields. Abdominal: soft, non-tender to palpation, non-distended MSK: normal bulk and tone Neurological: A&O x 3, follows commands. Skin: warm and dry Psych: normal behavior, normal affect  Assessment/Plan:  Principal Problem:   COPD exacerbation (HCC) Active Problems:   Primary hypertension  Steven [name] is a 51 y.o. with pertinent PMH of COPD, tobacco use disorder, and hypertension admitted for COPD exacerbation.   COPD exacerbation, improving  Severe bullous emphysema emphysema Doing well on RA at rest. He reports near-resolution of symptoms, including SHOB and cough. Does still have some mild, though improved,  expiratory wheezing in lower lung fields. Very close to discharging today, however given de-saturation to 87% with ambulation and tachycardia (see below), we will continue with tx course as outlined below and continue to monitor for clinical improvement.   - Ipratropium q6h alone in setting of ongoing tachycardia; RESPIRATORY THERAPY: PLEASE DO NOT ADD ALBUTEROL - Continue home LABA/ICS - Dulera  - Holding LAMA - Spiriva  - Prednisone 40 mg bid x 5 days, last dose 6/3 - Azithromycin 500 mg x 3 days, last dose 6/1 - Wean off of supplemental O2 as tolerated, goal SpO2 88-92%  - Home O2 orders placed  - Discussed above plan with nursing staff - Offered inhaler educations, but he states that he has a good understanding of his inhalers, and reports appropriate use.  - Close [facility] follow-up on 01/06/22 at 1:45 pm -- he should have insurance by then; we will place pulmonology referral and address need for PFTs.    Tachycardia Improved from 120's to 100's with changing Duonebs to ipratropium alone. Suspect this is 2/2 sympathic activation from albuterol use, given spike in HR back to 120's after receiving albuterol because RT adjusted orders and started short-acting/albuterol. No concern for PE given no chest pain or SHOB.  - RESPIRATORY THERAPY: PLEASE DO NOT ADD ALBUTEROL   Hypertension Improved SBP from 150 -> 130's. DBP elevated in 110's. Only received a total of 1/2L of IVF.  Asymptomatic; denies headaches, vision changes, or confusion. BP improved to 130/90's since admission. Currently on amlodipine 10 mg only.   - Discontinued home HCTZ 25  mg   - Continue Amlodipine 10 mg daily  - Consider starting Losartan 25 mg daily if hypertensive    Tobacco use disorder  Daily nicotine patch was ordered and he is using. Previously prescribed chantix, but reports that he was not using. Discussed Chantix and Bupropion, and he is agreeable to starting at time of discharge.     Polycythemia Hgb 19.5 (admission)  -> 17.9 (today). Likely secondary to tobacco use disorder and chronic hypoxia, given chronically elevated. Improved with ongoing treatment. Can consider further work up with EPO in the OP setting.    AGMA, resolved  AG 20 with bicarb low at 19 on admission. Improved AG to 16 -> 9.   Hyponatremia Hyponatremic 129 -> 127 on admission. Euvolemic and asymptomatic. Dc'd HCTZ and Na improved to 131. - Avoid HCTZ. - Trend BMP     Best Practice: Diet: Normal IVF: None VTE: Enoxaparin Code: Full   Carmel Sacramento, MD  Internal Medicine Resident, PGY-1 Pager: [phone] After 5pm on weekdays and 1pm on weekends: On Call pager [phone]

## 2021-12-29 NOTE — Progress Notes (Signed)
Mobility Specialist Progress Note   12/29/21 1353  Mobility  Activity Ambulated with assistance in hallway  Level of Assistance Standby assist, set-up cues, supervision of patient - no hands on  Assistive Device Front wheel walker  Distance Ambulated (ft) 250 ft  Activity Response Tolerated well  $Mobility charge 1 Mobility   Pre Mobility: 147 HR, 94% SpO2 on RA  During Mobility: 152 HR, 87% SpO2 on 2LO2 Post Mobility: 131 HR, 93% SpO2  Received in chair having no complaints and eager to go home. No physical assistance required throughout but pt requesting to use RW to conserve energy during O2 sat test. On RA pt declined to 87% SpO2 and didn't rise until 4LO2 was applied. Able to remain stable on 3LO2 staying >91% SpO2 for remainder of session. Returned back to chair where pt also c/o of SOB, recommended pursed lip breathing and pt felt better. Left call bell by side and notified RN about elevated heart throughout session(>130 bpm)   Frederico Hamman Mobility Specialist Phone Number [phone]

## 2021-12-30 ENCOUNTER — Other Ambulatory Visit (HOSPITAL_COMMUNITY): Payer: Self-pay

## 2021-12-30 DIAGNOSIS — J441 Chronic obstructive pulmonary disease with (acute) exacerbation: Secondary | ICD-10-CM | POA: Diagnosis not present

## 2021-12-30 DIAGNOSIS — F17210 Nicotine dependence, cigarettes, uncomplicated: Secondary | ICD-10-CM | POA: Diagnosis not present

## 2021-12-30 LAB — CBC
HCT: 49.4 % (ref 39.0–52.0)
Hemoglobin: 17.2 g/dL — ABNORMAL HIGH (ref 13.0–17.0)
MCH: 29.1 pg (ref 26.0–34.0)
MCHC: 34.8 g/dL (ref 30.0–36.0)
MCV: 83.4 fL (ref 80.0–100.0)
Platelets: 187 10*3/uL (ref 150–400)
RBC: 5.92 MIL/uL — ABNORMAL HIGH (ref 4.22–5.81)
RDW: 12.2 % (ref 11.5–15.5)
WBC: 7.5 10*3/uL (ref 4.0–10.5)
nRBC: 0 % (ref 0.0–0.2)

## 2021-12-30 LAB — BASIC METABOLIC PANEL
Anion gap: 9 (ref 5–15)
BUN: 19 mg/dL (ref 6–20)
CO2: 31 mmol/L (ref 22–32)
Calcium: 9 mg/dL (ref 8.9–10.3)
Chloride: 90 mmol/L — ABNORMAL LOW (ref 98–111)
Creatinine, Ser: 0.97 mg/dL (ref 0.61–1.24)
GFR, Estimated: >60 mL/min (ref >60)
Glucose, Bld: 107 mg/dL — ABNORMAL HIGH (ref 70–99)
Potassium: 3.1 mmol/L — ABNORMAL LOW (ref 3.5–5.1)
Sodium: 130 mmol/L — ABNORMAL LOW (ref 135–145)

## 2021-12-30 LAB — MAGNESIUM: Magnesium: 2.4 mg/dL (ref 1.7–2.4)

## 2021-12-30 MED ORDER — BUPROPION HCL ER (XL) 150 MG PO TB24
150.0000 mg | ORAL_TABLET | Freq: Every day | ORAL | 0 refills | Status: DC
  Filled 2021-12-30: qty 30, 30d supply, fill #0

## 2021-12-30 MED ORDER — LOSARTAN POTASSIUM 25 MG PO TABS
25.0000 mg | ORAL_TABLET | Freq: Every day | ORAL | 0 refills | Status: DC
  Filled 2021-12-30: qty 30, 30d supply, fill #0

## 2021-12-30 MED ORDER — FLUTICASONE-SALMETEROL 115-21 MCG/ACT IN AERO
2.0000 | INHALATION_SPRAY | Freq: Two times a day (BID) | RESPIRATORY_TRACT | 12 refills | Status: DC
  Filled 2021-12-30: qty 8, 15d supply, fill #0

## 2021-12-30 MED ORDER — POTASSIUM CHLORIDE CRYS ER 20 MEQ PO TBCR
40.0000 meq | EXTENDED_RELEASE_TABLET | Freq: Once | ORAL | Status: AC
Start: 2021-12-30 — End: 2021-12-30
  Administered 2021-12-30: 40 meq via ORAL
  Filled 2021-12-30: qty 2

## 2021-12-30 MED ORDER — IPRATROPIUM BROMIDE 0.02 % IN SOLN
0.5000 mg | Freq: Two times a day (BID) | RESPIRATORY_TRACT | 12 refills | Status: DC
  Filled 2021-12-30 (×2): qty 75, 15d supply, fill #0

## 2021-12-30 MED ORDER — IPRATROPIUM BROMIDE 0.02 % IN SOLN: 0.5000 mg | RESPIRATORY_TRACT | Status: DC | PRN

## 2021-12-30 MED ORDER — PREDNISONE 20 MG PO TABS
40.0000 mg | ORAL_TABLET | Freq: Every day | ORAL | 0 refills | Status: AC
  Filled 2021-12-30: qty 6, 3d supply, fill #0

## 2021-12-30 MED ORDER — VARENICLINE TARTRATE 0.5 MG PO TABS
0.5000 mg | ORAL_TABLET | Freq: Two times a day (BID) | ORAL | 3 refills | Status: DC
  Filled 2021-12-30: qty 30, 15d supply, fill #0

## 2021-12-30 MED ORDER — DULERA 200-5 MCG/ACT IN AERO
2.0000 | INHALATION_SPRAY | Freq: Every day | RESPIRATORY_TRACT | 0 refills | Status: DC
  Filled 2021-12-30: qty 13, 30d supply, fill #0

## 2021-12-30 MED ORDER — FLUTICASONE-SALMETEROL 100-50 MCG/ACT IN AEPB
1.0000 | INHALATION_SPRAY | Freq: Two times a day (BID) | RESPIRATORY_TRACT | 2 refills | Status: DC
  Filled 2021-12-30 – 2022-01-31 (×2): qty 60, 30d supply, fill #0

## 2021-12-30 MED ORDER — BUDESONIDE-FORMOTEROL FUMARATE 160-4.5 MCG/ACT IN AERO
2.0000 | INHALATION_SPRAY | Freq: Two times a day (BID) | RESPIRATORY_TRACT | 12 refills | Status: DC
  Filled 2021-12-30: qty 10.2, 30d supply, fill #0

## 2021-12-30 MED ORDER — VARENICLINE TARTRATE 0.5 MG PO TABS
ORAL_TABLET | ORAL | 0 refills | Status: AC
  Filled 2021-12-30: qty 60, 20d supply, fill #0

## 2021-12-30 MED ORDER — NICOTINE 21 MG/24HR TD PT24
21.0000 mg | MEDICATED_PATCH | Freq: Every day | TRANSDERMAL | 0 refills | Status: AC
  Filled 2021-12-30: qty 28, 28d supply, fill #0

## 2021-12-30 MED ORDER — IPRATROPIUM BROMIDE 0.02 % IN SOLN
0.5000 mg | RESPIRATORY_TRACT | 12 refills | Status: DC | PRN
Start: 2021-12-30 — End: 2022-06-27
  Filled 2021-12-30: qty 75, 5d supply, fill #0
  Filled 2022-01-13 – 2022-01-14 (×2): qty 75, 5d supply, fill #1

## 2021-12-30 MED ORDER — IPRATROPIUM BROMIDE 0.02 % IN SOLN: 0.5000 mg | Freq: Two times a day (BID) | RESPIRATORY_TRACT | Status: DC

## 2021-12-30 MED ORDER — AMLODIPINE BESYLATE 10 MG PO TABS
10.0000 mg | ORAL_TABLET | Freq: Every day | ORAL | 0 refills | Status: DC
  Filled 2021-12-30: qty 30, 30d supply, fill #0

## 2021-12-30 MED ORDER — SPIRIVA HANDIHALER 18 MCG IN CAPS
18.0000 ug | ORAL_CAPSULE | Freq: Every day | RESPIRATORY_TRACT | 0 refills | Status: DC
  Filled 2021-12-30 (×2): qty 30, 30d supply, fill #0

## 2021-12-30 NOTE — Progress Notes (Signed)
Mobility Specialist Progress Note:   12/30/21 1043  Mobility  Activity Ambulated with assistance in hallway  Level of Assistance Standby assist, set-up cues, supervision of patient - no hands on  Assistive Device Front wheel walker  Distance Ambulated (ft) 250 ft  Activity Response Tolerated well  $Mobility charge 1 Mobility   Pt received in chair willing to participate in mobility. No complaints of pain. Left in bed with call bell in reach and all needs met.   [facility] Joene Gelder Mobility Specialist

## 2021-12-30 NOTE — TOC Progression Note (Signed)
Transition of Care [facility]) - Progression Note    Patient Details  Name: Steven [name] MRN: [id number] Date of Birth: 10/19/1970  Transition of Care [facility]) CM/SW [place], RN Phone Number:[phone]  12/30/2021, 11:54 AM  Clinical Narrative:    [facility] consulted for patient with home O2 needs. Patient is uninsured . O2 ordered per [employer] with [facility]. Referral has been called to [facility] with [employer] and to be delivered to the room.        Expected Discharge Plan and Services           Expected Discharge Date: 12/30/21                                     Social Determinants of Health (SDOH) Interventions    Readmission Risk Interventions     View : No data to display.

## 2022-01-06 ENCOUNTER — Ambulatory Visit (INDEPENDENT_AMBULATORY_CARE_PROVIDER_SITE_OTHER): Payer: Commercial Managed Care - HMO | Admitting: Internal Medicine

## 2022-01-06 ENCOUNTER — Other Ambulatory Visit (HOSPITAL_COMMUNITY): Payer: Self-pay

## 2022-01-06 VITALS — BP 121/95 | HR 99 | Temp 97.8°F | Ht 70.0 in | Wt 180.1 lb

## 2022-01-06 DIAGNOSIS — D751 Secondary polycythemia: Secondary | ICD-10-CM | POA: Diagnosis not present

## 2022-01-06 DIAGNOSIS — E878 Other disorders of electrolyte and fluid balance, not elsewhere classified: Secondary | ICD-10-CM | POA: Diagnosis not present

## 2022-01-06 DIAGNOSIS — J441 Chronic obstructive pulmonary disease with (acute) exacerbation: Secondary | ICD-10-CM | POA: Diagnosis not present

## 2022-01-06 DIAGNOSIS — E871 Hypo-osmolality and hyponatremia: Secondary | ICD-10-CM

## 2022-01-06 DIAGNOSIS — R000 Tachycardia, unspecified: Secondary | ICD-10-CM

## 2022-01-06 DIAGNOSIS — Z0000 Encounter for general adult medical examination without abnormal findings: Secondary | ICD-10-CM | POA: Diagnosis not present

## 2022-01-06 DIAGNOSIS — Z720 Tobacco use: Secondary | ICD-10-CM

## 2022-01-06 DIAGNOSIS — I10 Essential (primary) hypertension: Secondary | ICD-10-CM

## 2022-01-06 MED ORDER — UMECLIDINIUM BROMIDE 62.5 MCG/ACT IN AEPB
1.0000 | INHALATION_SPRAY | Freq: Every day | RESPIRATORY_TRACT | 3 refills | Status: DC
  Filled 2022-01-06: qty 30, 30d supply, fill #0

## 2022-01-06 MED ORDER — INCRUSE ELLIPTA 62.5 MCG/ACT IN AEPB
1.0000 | INHALATION_SPRAY | Freq: Every day | RESPIRATORY_TRACT | 3 refills | Status: DC
  Filled 2022-01-06: qty 30, 30d supply, fill #0

## 2022-01-06 MED ORDER — BREZTRI AEROSPHERE 160-9-4.8 MCG/ACT IN AERO
2.0000 | INHALATION_SPRAY | Freq: Two times a day (BID) | RESPIRATORY_TRACT | 2 refills | Status: DC
  Filled 2022-01-06 – 2022-06-16 (×4): qty 10.7, 30d supply, fill #0

## 2022-01-06 MED ORDER — SPIRIVA HANDIHALER 18 MCG IN CAPS
18.0000 ug | ORAL_CAPSULE | Freq: Every day | RESPIRATORY_TRACT | 2 refills | Status: DC
  Filled 2022-01-06: qty 30, 30d supply, fill #0

## 2022-01-06 NOTE — Assessment & Plan Note (Addendum)
Hemoglobin of 19.5 on admission, improved to 17.2 at time of discharge. Likely secondary to tobacco use disorder and chronic hypoxia, given chronically elevated.Improved with treatment.   CBC today   Addendum: CBC showing Hgb normalized to 15.5. Trend intermittently.

## 2022-01-06 NOTE — Assessment & Plan Note (Addendum)
Hypertensive on admission 150/110s. Home HCTZ was discontinued due to hyponatremia and he was started on losartan 25 mg qd and amlodipine 10 mg qd. BP improved 120/70 at discharge. BP 121/95 today. He reports good med compliance and tolerating well with no adverse effects. Asymptomatic at this time. He is counseled on monitoring BP at home and will bring log during follow up. We will continue with current regimen and follow up in 4 weeks.   Addendum: BMP with normal renal function. Cr 0.75. Continue with current medications at this time.

## 2022-01-06 NOTE — Patient Instructions (Addendum)
Please continue to use your advair   Please start taking Spiriva as prescribed   Continue with the Advair   If you notice that you are using ipratropium on a daily basis, call us and we will have to re-evaluate you   I will call with lab results   Monitor your Blood pressure at home and bring log with you the next time.

## 2022-01-06 NOTE — Assessment & Plan Note (Addendum)
Reports good compliance to chantix and buproprion, and states that his cravings have reduced. He reports smoking less compared to prior, and appears motivated to continue with smoking cessation journey. Continue Chantix and buproprion at this time.

## 2022-01-06 NOTE — Assessment & Plan Note (Signed)
Sinus tachycardia in the setting of albuterol use while admitted. He improved with discontinuing albuterol. HR 99 today. Pt reports feeling well with no concerns for palpitations or chest pain. He reports feeling well and at baseline.   Continue to avoid albuterol

## 2022-01-06 NOTE — Assessment & Plan Note (Signed)
Hyponatremic 129 > 127 this past admission. He was euvolemic and asymptomatic. Home HCTZ was discontinued with improvement of hyponatremia to 131 at discharge. He is on amlodipine and losartan for HTN at this time. Remains asymptomatic. Will check BMP today.

## 2022-01-06 NOTE — Assessment & Plan Note (Addendum)
During our visit, pt was unsure if he had insurance. [employer] card packet was provided. We collectively decided to defer HM at this time until follow up visit once insurance status confirmed. Since visit, pharmacy confirmed that he has personal insurance, so medications will not be covered under "IM program". Pulmonology referral placed. Will address HM at follow up visit.

## 2022-01-06 NOTE — Progress Notes (Signed)
CC: Hospital follow up for COPD exacerbation admission from 12/28/21 - 12/30/21  HPI:  Mr.Cleavon Noy is a 51 y.o. male with a PMHx stated below and presents today for stated above. Please see the Encounters tab for problem-based Assessment & Plan for additional details.   Past Medical History:  Diagnosis Date   Asthma    COPD (chronic obstructive pulmonary disease) (HCC)    Hypertension     Current Outpatient Medications on File Prior to Visit  Medication Sig Dispense Refill   acetaminophen (TYLENOL) 500 MG tablet Take 1,000 mg by mouth every 6 (six) hours as needed for moderate pain or headache.     amLODipine (NORVASC) 10 MG tablet Take 1 tablet (10 mg total) by mouth daily. 30 tablet 0   buPROPion (WELLBUTRIN XL) 150 MG 24 hr tablet Take 1 tablet (150 mg total) by mouth daily. 30 tablet 0   cetirizine (ZYRTEC) 10 MG tablet Take 1 tablet (10 mg total) by mouth daily. 90 tablet 0   fluticasone (FLONASE) 50 MCG/ACT nasal spray Place 1 spray into both nostrils daily. (Patient taking differently: Place 1 spray into both nostrils daily as needed for allergies.) 16 g 2   fluticasone-salmeterol (ADVAIR) 100-50 MCG/ACT AEPB Inhale 1 puff into the lungs 2 (two) times daily. 60 each 2   ipratropium (ATROVENT) 0.02 % nebulizer solution USe 1 vial (0.5 mg total) by nebulization every 4 (four) hours as needed for wheezing or shortness of breath. 75 mL 12   ipratropium (ATROVENT) 0.02 % nebulizer solution Take 2.5 mLs (0.5 mg total) by nebulization 2 (two) times daily and every 4 hours as needed 75 mL 12   losartan (COZAAR) 25 MG tablet Take 1 tablet (25 mg total) by mouth daily. 30 tablet 0   nicotine (NICODERM CQ - DOSED IN MG/24 HOURS) 21 mg/24hr patch Place 1 patch (21 mg total) onto the skin daily. 28 patch 0   nicotine polacrilex (NICORETTE) 4 MG gum Take by mouth. (Patient not taking: Reported on 12/28/2021)     varenicline (CHANTIX) 0.5 MG tablet Take 1 tab once daily for days 1-3.  Take 1 tab twice daily for days 4-7. Take 2 tabs twice daily every day starting day 8. 60 tablet 0   varenicline (CHANTIX) 0.5 MG tablet Take 1 tablet (0.5 mg total) by mouth 2 (two) times daily. 30 tablet 3   No current facility-administered medications on file prior to visit.    No family history on file.  Social History   Socioeconomic History   Marital status: Single    Spouse name: Not on file   Number of children: Not on file   Years of education: Not on file   Highest education level: Not on file  Occupational History   Not on file  Tobacco Use   Smoking status: Every Day    Packs/day: 1.00    Types: Cigarettes   Smokeless tobacco: Never   Tobacco comments:    Wants to get med to quit  Substance and Sexual Activity   Alcohol use: Yes   Drug use: Not Currently   Sexual activity: Not on file  Other Topics Concern   Not on file  Social History Narrative   Not on file   Social Determinants of Health   Financial Resource Strain: Not on file  Food Insecurity: Not on file  Transportation Needs: Not on file  Physical Activity: Not on file  Stress: Not on file  Social Connections:  Not on file  Intimate Partner Violence: Not on file    Review of Systems: ROS negative except for what is noted on the assessment and plan.  Vitals:   01/06/22 1404  BP: (!) 121/95  Pulse: 99  Temp: 97.8 F (36.6 C)  TempSrc: Oral  SpO2: 100%  Weight: 180 lb 1.6 oz (81.7 kg)  Height: 5\' 10"  (1.778 m)    Physical Exam: Constitutional: alert, well-appearing, in NAD Cardiovascular: RRR, no m/r/g, non-edematous bilateral LE Pulmonary/Chest: normal work of breathing on RA, LCTAB MSK: normal bulk and tone  Neurological: A&O x 3 and follows commands  Skin: warm and dry  Psych: normal behavior, normal affect   Assessment & Plan:   See Encounters Tab for problem based charting.  Patient discussed with Dr. , MD  Internal Medicine Resident, PGY-1 [facility]  Internal Medicine Residency

## 2022-01-06 NOTE — Assessment & Plan Note (Addendum)
Admitted 12/28/21 - 12/30/21 for COPD exacerbation. Patient with history of severe bullous emphysema (no formal PFTs) and tobacco use disorder presented with 3 days of increased dyspnea and productive cough which has not improved with home nebulizer or rescue inhaler. Patient received 125 mg solumedrol and duonebs x2 via EMS and was placed on BiPAP in the ED.ABG with pH of 7.33, bicarb 32.4 and pCO2 elevated to 61. He was afebrile and without leukocytosis, and no signs of pneumonia on CXR. CXR showed severe bullous emphysema. He was started on Duonebs, azithromycin (500 mg, 3 day course) and prednisone (5 day course). Continuedhome LAMA/LABA/ICS-- Spiriva and Dulera. Within 8 hours, he improved significantly and was off of Bipap, satting >90% on room air. Exam notable for mild end-expiratory wheezes most prominent in lower lung fields, but much improved compared to admission. At time of discharge  Ruthe Mannan was changed to Advair due to coverage concerns. Albuterol discontinued for tachycardia, and continued ipratropium. He reports good compliance to Advair. He has no ongoing sxs and reports feeling well and baseline. Exam reassuring as above. He reports not using ipratropium often. He has no complaints at this time. Since he is COPD GOLD stage E given recent hospitalization, he needs LAMA/LABA/ICS. Planned to continue Advair (LABA/ICS) and add LAMA, however, unfortunately no LAMA inhaler is covered by his insurance at a reasonable price.  -Instructed to continue Advair for now pending Breztri -Sent in Kouts (LAMA/LABA/ICS) to his pharmacy; instructed to discontinue Adviair and start Breztri alone once obtained.  -Instructed to call us if using ipratropium more frequently in the setting of adding a LAMA to his regimen. - Referral to pulmonology placed for PFTs - Continue to encourage smoking cessation

## 2022-01-07 ENCOUNTER — Other Ambulatory Visit (HOSPITAL_COMMUNITY): Payer: Self-pay

## 2022-01-08 LAB — BMP8+ANION GAP
Anion Gap: 14 mmol/L (ref 10.0–18.0)
BUN/Creatinine Ratio: 13 (ref 9–20)
BUN: 10 mg/dL (ref 6–24)
CO2: 22 mmol/L (ref 20–29)
Calcium: 9 mg/dL (ref 8.7–10.2)
Chloride: 99 mmol/L (ref 96–106)
Creatinine, Ser: 0.75 mg/dL — ABNORMAL LOW (ref 0.76–1.27)
Glucose: 125 mg/dL — ABNORMAL HIGH (ref 70–99)
Potassium: 4.7 mmol/L (ref 3.5–5.2)
Sodium: 135 mmol/L (ref 134–144)
eGFR: 109 mL/min/{1.73_m2} (ref >59)

## 2022-01-08 LAB — CBC
Hematocrit: 44.9 % (ref 37.5–51.0)
Hemoglobin: 15.5 g/dL (ref 13.0–17.7)
MCH: 28.2 pg (ref 26.6–33.0)
MCHC: 34.5 g/dL (ref 31.5–35.7)
MCV: 82 fL (ref 79–97)
Platelets: 340 10*3/uL (ref 150–450)
RBC: 5.5 x10E6/uL (ref 4.14–5.80)
RDW: 12.1 % (ref 11.6–15.4)
WBC: 8.8 10*3/uL (ref 3.4–10.8)

## 2022-01-11 NOTE — Progress Notes (Signed)
Internal Medicine Clinic Attending  Case discussed with Dr. Patel  At the time of the visit.  We reviewed the resident's history and exam and pertinent patient test results.  I agree with the assessment, diagnosis, and plan of care documented in the resident's note.  

## 2022-01-13 ENCOUNTER — Other Ambulatory Visit (HOSPITAL_COMMUNITY): Payer: Self-pay

## 2022-01-14 ENCOUNTER — Other Ambulatory Visit (HOSPITAL_COMMUNITY): Payer: Self-pay

## 2022-01-27 ENCOUNTER — Other Ambulatory Visit (HOSPITAL_COMMUNITY): Payer: Self-pay

## 2022-01-31 ENCOUNTER — Other Ambulatory Visit (HOSPITAL_COMMUNITY): Payer: Self-pay

## 2022-01-31 ENCOUNTER — Other Ambulatory Visit: Payer: Self-pay | Admitting: Internal Medicine

## 2022-01-31 MED ORDER — FLUTICASONE-SALMETEROL 100-50 MCG/ACT IN AEPB
1.0000 | INHALATION_SPRAY | Freq: Two times a day (BID) | RESPIRATORY_TRACT | 2 refills | Status: DC
  Filled 2022-01-31: qty 90, 45d supply, fill #0

## 2022-02-03 ENCOUNTER — Encounter: Payer: Commercial Managed Care - HMO | Admitting: Student

## 2022-02-07 ENCOUNTER — Other Ambulatory Visit (HOSPITAL_COMMUNITY): Payer: Self-pay

## 2022-02-08 ENCOUNTER — Other Ambulatory Visit (HOSPITAL_COMMUNITY): Payer: Self-pay

## 2022-05-04 ENCOUNTER — Other Ambulatory Visit (HOSPITAL_COMMUNITY): Payer: Self-pay

## 2022-05-04 ENCOUNTER — Other Ambulatory Visit: Payer: Self-pay | Admitting: Student

## 2022-05-04 DIAGNOSIS — J441 Chronic obstructive pulmonary disease with (acute) exacerbation: Secondary | ICD-10-CM

## 2022-05-04 DIAGNOSIS — R519 Headache, unspecified: Secondary | ICD-10-CM

## 2022-05-04 DIAGNOSIS — J302 Other seasonal allergic rhinitis: Secondary | ICD-10-CM

## 2022-05-04 DIAGNOSIS — I10 Essential (primary) hypertension: Secondary | ICD-10-CM

## 2022-05-04 DIAGNOSIS — Z720 Tobacco use: Secondary | ICD-10-CM

## 2022-05-04 MED ORDER — VARENICLINE TARTRATE 0.5 MG PO TABS
0.5000 mg | ORAL_TABLET | Freq: Two times a day (BID) | ORAL | 3 refills | Status: DC
  Filled 2022-05-04: qty 30, 15d supply, fill #0

## 2022-05-04 MED ORDER — FLUTICASONE PROPIONATE 50 MCG/ACT NA SUSP
1.0000 | Freq: Every day | NASAL | 2 refills | Status: AC
  Filled 2022-05-04 – 2022-07-27 (×2): qty 16, 60d supply, fill #0

## 2022-05-04 MED ORDER — ACETAMINOPHEN 500 MG PO TABS
1000.0000 mg | ORAL_TABLET | Freq: Four times a day (QID) | ORAL | 3 refills | Status: AC | PRN
  Filled 2022-05-04: qty 30, 4d supply, fill #0

## 2022-05-04 MED ORDER — LOSARTAN POTASSIUM 25 MG PO TABS
25.0000 mg | ORAL_TABLET | Freq: Every day | ORAL | 0 refills | Status: DC
  Filled 2022-05-04 – 2022-05-26 (×2): qty 30, 30d supply, fill #0

## 2022-05-04 MED ORDER — IPRATROPIUM BROMIDE 0.02 % IN SOLN
0.5000 mg | Freq: Two times a day (BID) | RESPIRATORY_TRACT | 12 refills | Status: DC
  Filled 2022-05-04: qty 75, 5d supply, fill #0

## 2022-05-04 MED ORDER — HYDROCHLOROTHIAZIDE 25 MG PO TABS
25.0000 mg | ORAL_TABLET | Freq: Every day | ORAL | 0 refills | Status: DC
  Filled 2022-05-04 (×2): qty 30, 30d supply, fill #0

## 2022-05-04 MED ORDER — FLUTICASONE-SALMETEROL 100-50 MCG/ACT IN AEPB
1.0000 | INHALATION_SPRAY | Freq: Two times a day (BID) | RESPIRATORY_TRACT | 2 refills | Status: DC
  Filled 2022-05-04 – 2022-05-26 (×4): qty 180, 90d supply, fill #0
  Filled 2022-05-26: qty 60, 30d supply, fill #0

## 2022-05-04 MED ORDER — CETIRIZINE HCL 10 MG PO TABS
10.0000 mg | ORAL_TABLET | Freq: Every day | ORAL | 0 refills | Status: AC
  Filled 2022-05-04: qty 90, 90d supply, fill #0

## 2022-05-04 MED ORDER — NICOTINE POLACRILEX 4 MG MT GUM
4.0000 mg | CHEWING_GUM | OROMUCOSAL | 3 refills | Status: AC | PRN
  Filled 2022-05-04: qty 110, 30d supply, fill #0

## 2022-05-04 NOTE — Telephone Encounter (Signed)
Call the patient to clarify medications as he was unclear when she should be taking.  He states that he was unable to pick up the [facility] due to price and has been continuing to take the [facility].  He also states that he has not been using his ipratropium as much as he was previously.  He also states that he frequently had lower extremity edema after starting amlodipine and losartan.  Patient was counseled that this was most likely the amlodipine and caused it.  However he stopped taking both medications only after a few days.  He has been taking his original HCTZ 25 mg daily since then and states that his wife has been checking his blood pressure at home and although he cannot remember what they have been he states they have not been high.  He has an appointment with Dr. Marlou Sa on 05/16/2022 so we we will refill his hydrochlorothiazide 25 mg enough to get him to that appointment and refill the rest of the medications except for the amlodipine and Breztri.  All questions were answered during the phone call.

## 2022-05-04 NOTE — Telephone Encounter (Signed)
Pt's (SO) Steven [name] (listed in the Chart) calling to state the pt has been out of his medications and is unsure of which ones he should be on.  Please call her back.  Appt has been sch for:    MRN: [id number]  Date: 05/16/2022 Status: Sch  Time: 2:45 PM Length: 30    Copay: $0.00  Provider: Farrel Gordon, DO     acetaminophen (TYLENOL) 500 MG tablet  amLODipine (NORVASC) 10 MG tablet (Expired) Budeson-Glycopyrrol-Formoterol (BREZTRI AEROSPHERE) 160-9-4.8 MCG/ACT AERO  buPROPion (WELLBUTRIN XL) 150 MG 24 hr tablet (Expired)  cetirizine (ZYRTEC) 10 MG tablet  fluticasone (FLONASE) 50 MCG/ACT nasal spray  fluticasone-salmeterol (ADVAIR) 100-50 MCG/ACT AEPB   ipratropium (ATROVENT) 0.02 % nebulizer solution  losartan (COZAAR) 25 MG tablet (Expired) nicotine polacrilex (NICORETTE) 4 MG gum  varenicline (CHANTIX) 0.5 MG tablet  [facility] [place]

## 2022-05-04 NOTE — Telephone Encounter (Signed)
Next appt scheduled 10/16 with Dr Marlou Sa.

## 2022-05-13 ENCOUNTER — Other Ambulatory Visit (HOSPITAL_COMMUNITY): Payer: Self-pay

## 2022-05-16 ENCOUNTER — Encounter: Payer: Commercial Managed Care - HMO | Admitting: Internal Medicine

## 2022-05-16 ENCOUNTER — Encounter: Payer: Self-pay | Admitting: Student

## 2022-05-26 ENCOUNTER — Other Ambulatory Visit: Payer: Self-pay | Admitting: Student

## 2022-05-26 ENCOUNTER — Other Ambulatory Visit (HOSPITAL_COMMUNITY): Payer: Self-pay

## 2022-05-26 DIAGNOSIS — I10 Essential (primary) hypertension: Secondary | ICD-10-CM

## 2022-05-26 MED ORDER — LOSARTAN POTASSIUM 25 MG PO TABS
25.0000 mg | ORAL_TABLET | Freq: Every day | ORAL | 0 refills | Status: DC
  Filled 2022-05-26: qty 30, 30d supply, fill #0

## 2022-05-27 ENCOUNTER — Encounter: Payer: Self-pay | Admitting: Student

## 2022-05-27 ENCOUNTER — Encounter: Payer: Commercial Managed Care - HMO | Admitting: Internal Medicine

## 2022-05-27 NOTE — Progress Notes (Deleted)
Med rec

## 2022-05-30 ENCOUNTER — Other Ambulatory Visit (HOSPITAL_COMMUNITY): Payer: Self-pay

## 2022-06-07 ENCOUNTER — Other Ambulatory Visit (HOSPITAL_COMMUNITY): Payer: Self-pay

## 2022-06-11 IMAGING — DX DG CHEST 1V PORT
1 series · 2 of 2 positions shown · non-contrast
Comparison: 03/02/2021

CLINICAL DATA: Shortness of breath, COPD

EXAM:
PORTABLE CHEST 1 VIEW

[Series 1: chest · 0.14mm/px · 2 of 2 slices shown]
[im 1/2]
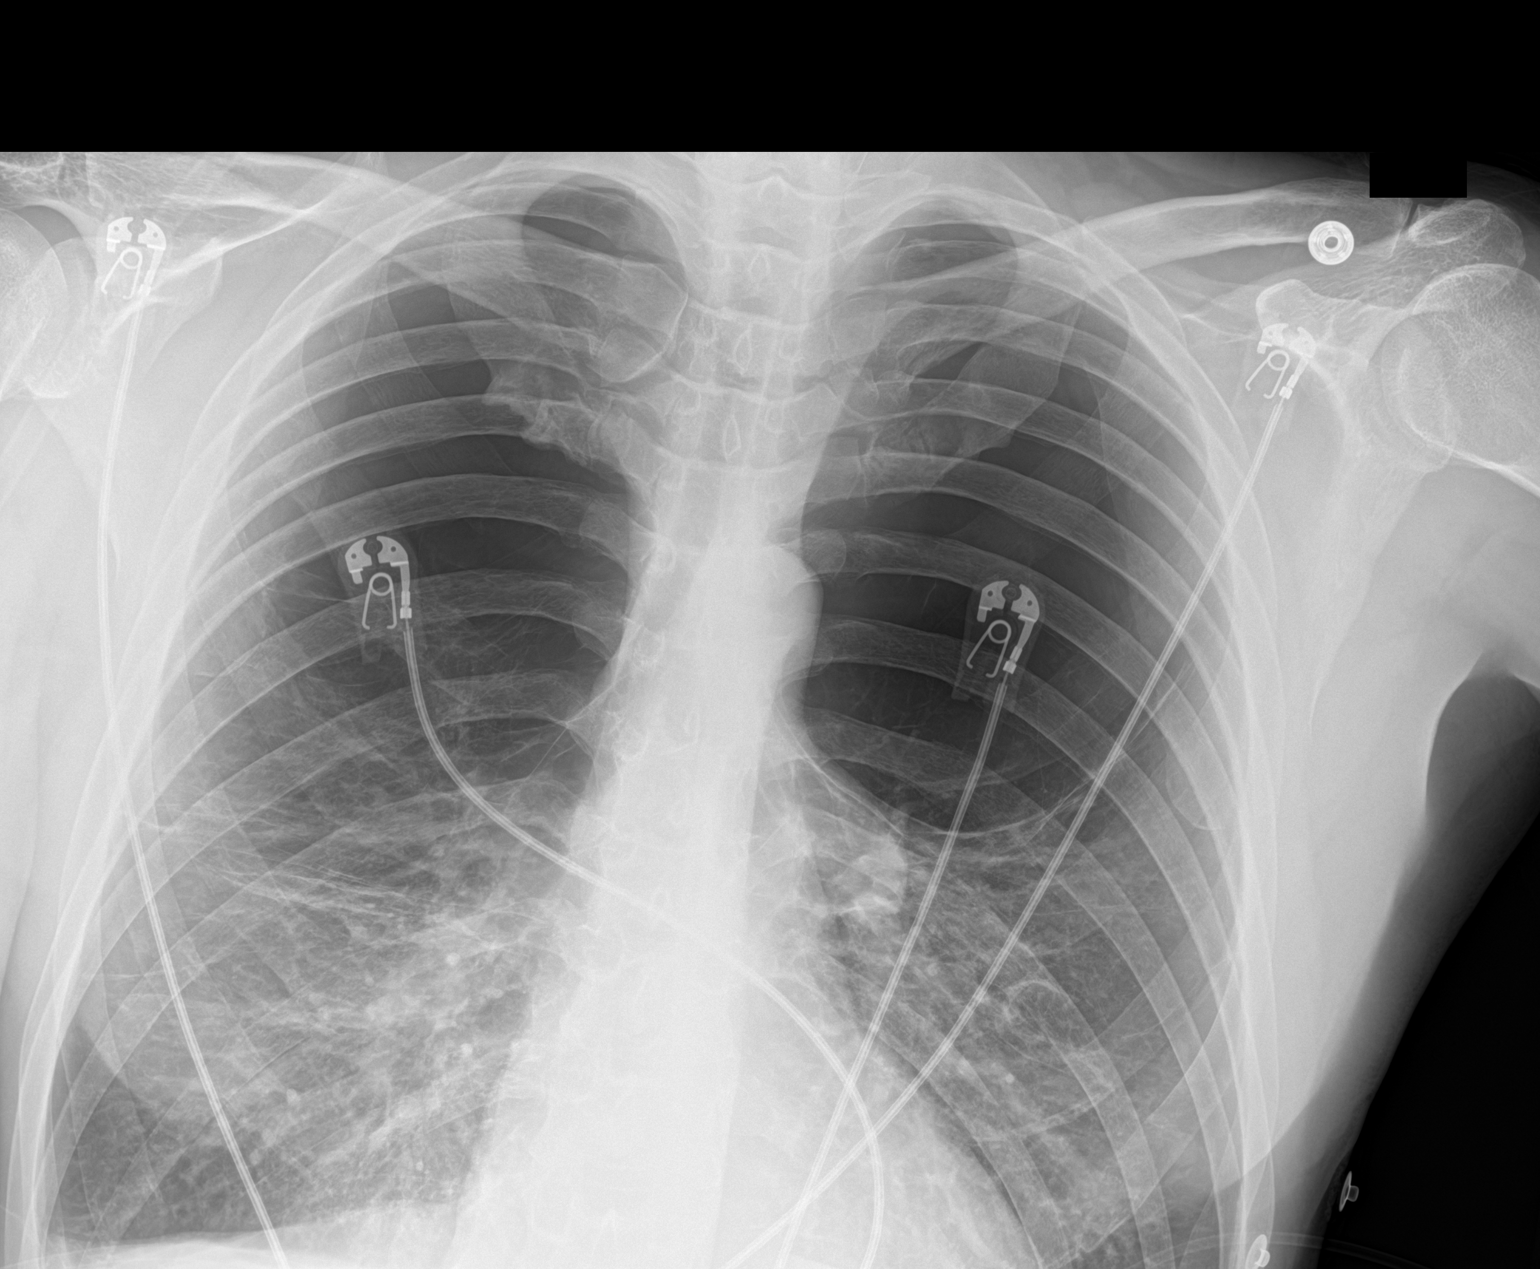
[im 2/2]
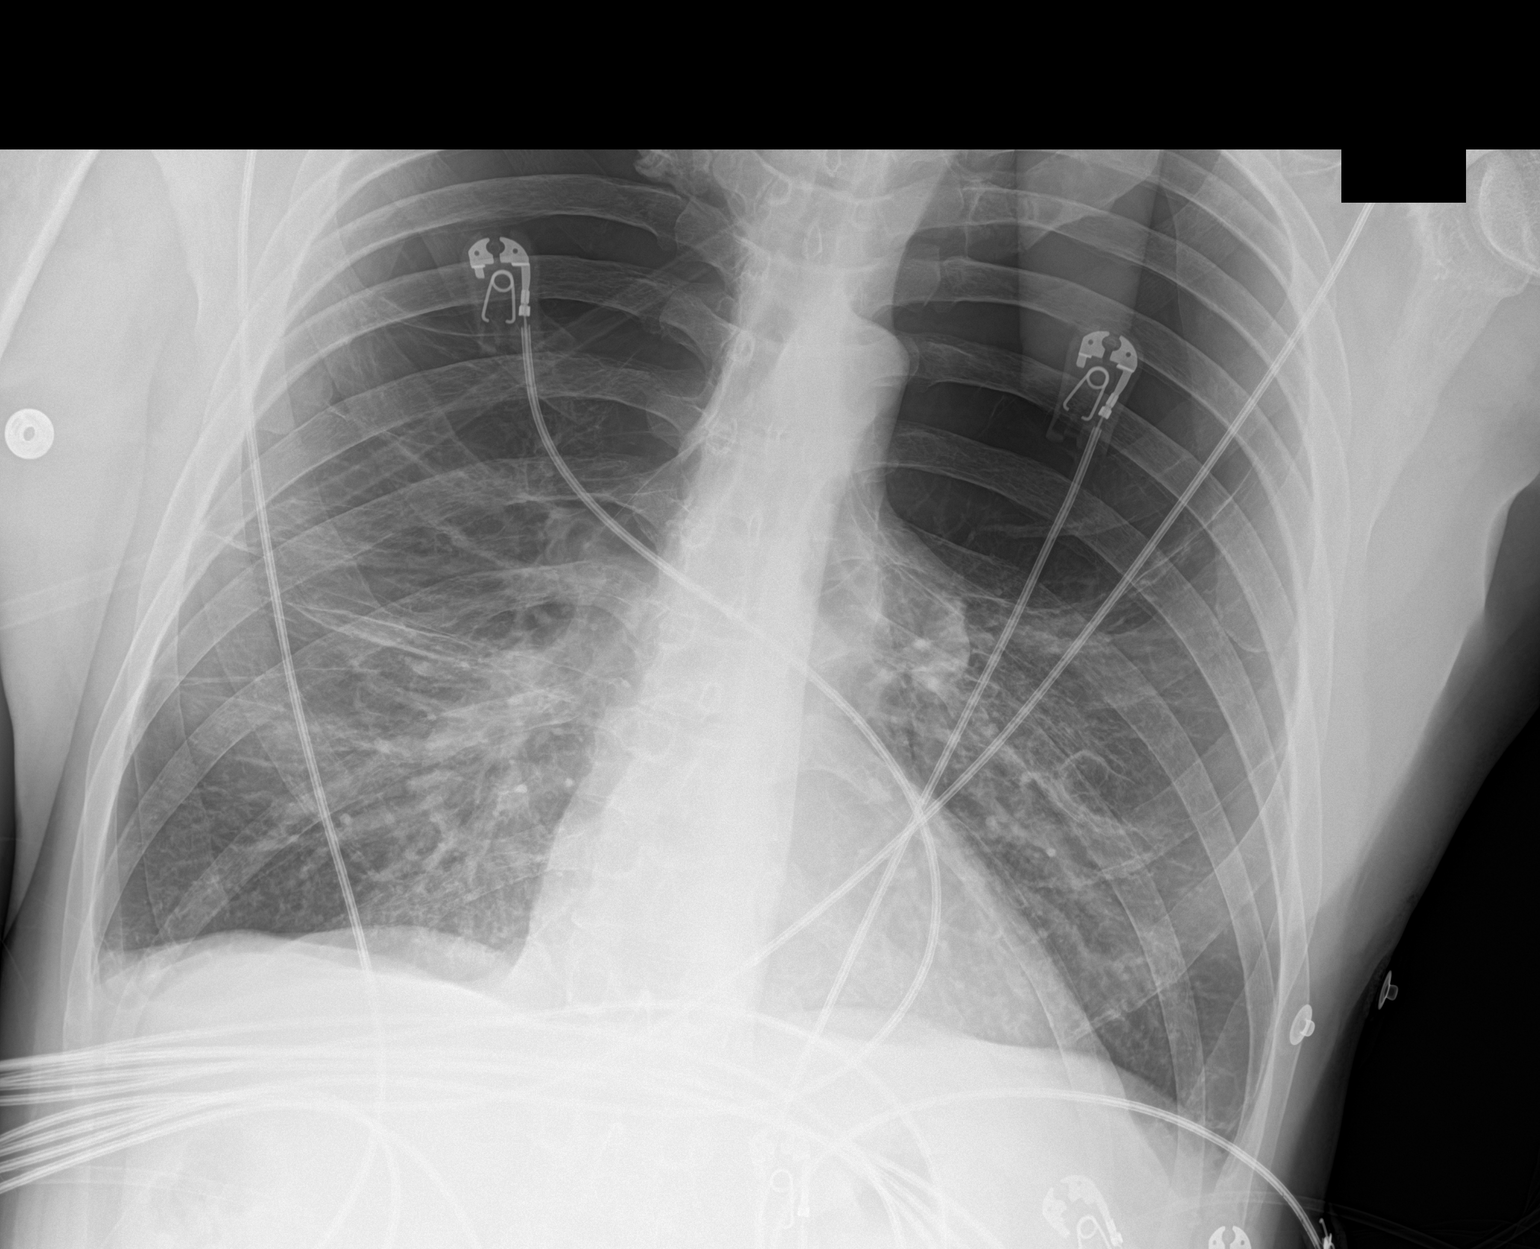

[2 of 2 positions shown; findings below may reference images not displayed]

FINDINGS: Severe bullous emphysema again noted. No confluent airspace
opacities or effusions. Heart is normal size. No acute bony
abnormality.
IMPRESSION: Severe bullous emphysema.

No active disease.

## 2022-06-15 ENCOUNTER — Encounter (HOSPITAL_COMMUNITY): Payer: Self-pay | Admitting: Internal Medicine

## 2022-06-15 ENCOUNTER — Emergency Department (HOSPITAL_COMMUNITY): Payer: Commercial Managed Care - HMO

## 2022-06-15 ENCOUNTER — Inpatient Hospital Stay (HOSPITAL_COMMUNITY)
Admission: EM | Admit: 2022-06-15 | Discharge: 2022-06-16 | DRG: 190 | Disposition: A | Discharge status: Home / Self Care | Payer: Commercial Managed Care - HMO | Attending: Internal Medicine | Admitting: Internal Medicine

## 2022-06-15 ENCOUNTER — Other Ambulatory Visit (HOSPITAL_COMMUNITY): Payer: Self-pay

## 2022-06-15 ENCOUNTER — Other Ambulatory Visit: Payer: Self-pay

## 2022-06-15 DIAGNOSIS — E8729 Other acidosis: Secondary | ICD-10-CM | POA: Diagnosis present

## 2022-06-15 DIAGNOSIS — J9602 Acute respiratory failure with hypercapnia: Secondary | ICD-10-CM | POA: Diagnosis present

## 2022-06-15 DIAGNOSIS — I10 Essential (primary) hypertension: Secondary | ICD-10-CM | POA: Diagnosis present

## 2022-06-15 DIAGNOSIS — D751 Secondary polycythemia: Secondary | ICD-10-CM | POA: Diagnosis present

## 2022-06-15 DIAGNOSIS — Z1152 Encounter for screening for COVID-19: Secondary | ICD-10-CM | POA: Diagnosis not present

## 2022-06-15 DIAGNOSIS — F419 Anxiety disorder, unspecified: Secondary | ICD-10-CM | POA: Diagnosis present

## 2022-06-15 DIAGNOSIS — F17210 Nicotine dependence, cigarettes, uncomplicated: Secondary | ICD-10-CM | POA: Diagnosis present

## 2022-06-15 DIAGNOSIS — R7303 Prediabetes: Secondary | ICD-10-CM | POA: Diagnosis present

## 2022-06-15 DIAGNOSIS — Z79899 Other long term (current) drug therapy: Secondary | ICD-10-CM | POA: Diagnosis not present

## 2022-06-15 DIAGNOSIS — Z7951 Long term (current) use of inhaled steroids: Secondary | ICD-10-CM

## 2022-06-15 DIAGNOSIS — J439 Emphysema, unspecified: Principal | ICD-10-CM | POA: Diagnosis present

## 2022-06-15 DIAGNOSIS — J441 Chronic obstructive pulmonary disease with (acute) exacerbation: Secondary | ICD-10-CM | POA: Diagnosis present

## 2022-06-15 DIAGNOSIS — J9622 Acute and chronic respiratory failure with hypercapnia: Principal | ICD-10-CM

## 2022-06-15 DIAGNOSIS — Z23 Encounter for immunization: Secondary | ICD-10-CM

## 2022-06-15 LAB — I-STAT ARTERIAL BLOOD GAS, ED
Acid-Base Excess: 1 mmol/L (ref 0.0–2.0)
Acid-Base Excess: 3 mmol/L — ABNORMAL HIGH (ref 0.0–2.0)
Bicarbonate: 32.2 mmol/L — ABNORMAL HIGH (ref 20.0–28.0)
Bicarbonate: 29.6 mmol/L — ABNORMAL HIGH (ref 20.0–28.0)
Calcium, Ion: 1.21 mmol/L (ref 1.15–1.40)
Calcium, Ion: 1.18 mmol/L (ref 1.15–1.40)
HCT: 54 % — ABNORMAL HIGH (ref 39.0–52.0)
HCT: 53 % — ABNORMAL HIGH (ref 39.0–52.0)
Hemoglobin: 18.4 g/dL — ABNORMAL HIGH (ref 13.0–17.0)
Hemoglobin: 18 g/dL — ABNORMAL HIGH (ref 13.0–17.0)
O2 Saturation: 99 %
O2 Saturation: 99 %
Patient temperature: 97.3
Potassium: 3.4 mmol/L — ABNORMAL LOW (ref 3.5–5.1)
Potassium: 3.2 mmol/L — ABNORMAL LOW (ref 3.5–5.1)
Sodium: 136 mmol/L (ref 135–145)
Sodium: 139 mmol/L (ref 135–145)
TCO2: 35 mmol/L — ABNORMAL HIGH (ref 22–32)
TCO2: 31 mmol/L (ref 22–32)
pCO2 arterial: 80 mmHg (ref 32–48)
pCO2 arterial: 49.9 mmHg — ABNORMAL HIGH (ref 32–48)
pH, Arterial: 7.213 — ABNORMAL LOW (ref 7.35–7.45)
pH, Arterial: 7.377 (ref 7.35–7.45)
pO2, Arterial: 185 mmHg — ABNORMAL HIGH (ref 83–108)
pO2, Arterial: 133 mmHg — ABNORMAL HIGH (ref 83–108)

## 2022-06-15 LAB — CBC WITH DIFFERENTIAL/PLATELET
Abs Immature Granulocytes: 0.02 10*3/uL (ref 0.00–0.07)
Basophils Absolute: 0.1 10*3/uL (ref 0.0–0.1)
Basophils Relative: 1 %
Eosinophils Absolute: 0.8 10*3/uL — ABNORMAL HIGH (ref 0.0–0.5)
Eosinophils Relative: 8 %
HCT: 55.6 % — ABNORMAL HIGH (ref 39.0–52.0)
Hemoglobin: 17.4 g/dL — ABNORMAL HIGH (ref 13.0–17.0)
Immature Granulocytes: 0 %
Lymphocytes Relative: 32 %
Lymphs Abs: 3.3 10*3/uL (ref 0.7–4.0)
MCH: 27.9 pg (ref 26.0–34.0)
MCHC: 31.3 g/dL (ref 30.0–36.0)
MCV: 89.2 fL (ref 80.0–100.0)
Monocytes Absolute: 1.2 10*3/uL — ABNORMAL HIGH (ref 0.1–1.0)
Monocytes Relative: 12 %
Neutro Abs: 4.9 10*3/uL (ref 1.7–7.7)
Neutrophils Relative %: 47 %
Platelets: 305 10*3/uL (ref 150–400)
RBC: 6.23 MIL/uL — ABNORMAL HIGH (ref 4.22–5.81)
RDW: 13.4 % (ref 11.5–15.5)
WBC: 10.3 10*3/uL (ref 4.0–10.5)
nRBC: 0 % (ref 0.0–0.2)

## 2022-06-15 LAB — RESP PANEL BY RT-PCR (FLU A&B, COVID) ARPGX2
Influenza A by PCR: NEGATIVE
Influenza B by PCR: NEGATIVE
SARS Coronavirus 2 by RT PCR: NEGATIVE

## 2022-06-15 LAB — BASIC METABOLIC PANEL
Anion gap: 11 (ref 5–15)
BUN: 12 mg/dL (ref 6–20)
CO2: 27 mmol/L (ref 22–32)
Calcium: 8.9 mg/dL (ref 8.9–10.3)
Chloride: 100 mmol/L (ref 98–111)
Creatinine, Ser: 1.06 mg/dL (ref 0.61–1.24)
GFR, Estimated: >60 mL/min (ref >60)
Glucose, Bld: 202 mg/dL — ABNORMAL HIGH (ref 70–99)
Potassium: 3.9 mmol/L (ref 3.5–5.1)
Sodium: 138 mmol/L (ref 135–145)

## 2022-06-15 LAB — BLOOD GAS, VENOUS
Acid-Base Excess: 8.8 mmol/L — ABNORMAL HIGH (ref 0.0–2.0)
Bicarbonate: 34.1 mmol/L — ABNORMAL HIGH (ref 20.0–28.0)
O2 Saturation: 75.1 %
Patient temperature: 37
pCO2, Ven: 48 mmHg (ref 44–60)
pH, Ven: 7.46 — ABNORMAL HIGH (ref 7.25–7.43)
pO2, Ven: 40 mmHg (ref 32–45)

## 2022-06-15 LAB — TECHNOLOGIST SMEAR REVIEW

## 2022-06-15 LAB — HIV ANTIBODY (ROUTINE TESTING W REFLEX): HIV Screen 4th Generation wRfx: NONREACTIVE

## 2022-06-15 MED ORDER — BUDESON-GLYCOPYRROL-FORMOTEROL 160-9-4.8 MCG/ACT IN AERO: 2.0000 | INHALATION_SPRAY | Freq: Every day | RESPIRATORY_TRACT | Status: DC

## 2022-06-15 MED ORDER — AZITHROMYCIN 250 MG PO TABS
500.0000 mg | ORAL_TABLET | Freq: Every day | ORAL | Status: DC
  Administered 2022-06-15 – 2022-06-16 (×2): 500 mg via ORAL
  Filled 2022-06-15 (×2): qty 2

## 2022-06-15 MED ORDER — FLUTICASONE FUROATE-VILANTEROL 100-25 MCG/ACT IN AEPB
1.0000 | INHALATION_SPRAY | Freq: Every day | RESPIRATORY_TRACT | Status: DC
  Administered 2022-06-15 – 2022-06-16 (×2): 1 via RESPIRATORY_TRACT
  Filled 2022-06-15: qty 28

## 2022-06-15 MED ORDER — LORAZEPAM 2 MG/ML IJ SOLN
1.0000 mg | Freq: Once | INTRAMUSCULAR | Status: AC
  Administered 2022-06-15: 1 mg via INTRAVENOUS
  Filled 2022-06-15: qty 1

## 2022-06-15 MED ORDER — ALBUTEROL (5 MG/ML) CONTINUOUS INHALATION SOLN
15.0000 mg/h | INHALATION_SOLUTION | Freq: Once | RESPIRATORY_TRACT | Status: AC
  Administered 2022-06-15: 15 mg/h via RESPIRATORY_TRACT
  Filled 2022-06-15: qty 20

## 2022-06-15 MED ORDER — INFLUENZA VAC SPLIT QUAD 0.5 ML IM SUSY
0.5000 mL | PREFILLED_SYRINGE | INTRAMUSCULAR | Status: AC
  Administered 2022-06-16: 0.5 mL via INTRAMUSCULAR

## 2022-06-15 MED ORDER — UMECLIDINIUM BROMIDE 62.5 MCG/ACT IN AEPB
1.0000 | INHALATION_SPRAY | Freq: Every day | RESPIRATORY_TRACT | Status: DC
  Administered 2022-06-15 – 2022-06-16 (×2): 1 via RESPIRATORY_TRACT
  Filled 2022-06-15 (×3): qty 7

## 2022-06-15 MED ORDER — LOSARTAN POTASSIUM 25 MG PO TABS
25.0000 mg | ORAL_TABLET | Freq: Every day | ORAL | Status: DC
  Administered 2022-06-15 – 2022-06-16 (×2): 25 mg via ORAL
  Filled 2022-06-15 (×2): qty 1

## 2022-06-15 MED ORDER — ENOXAPARIN SODIUM 40 MG/0.4ML IJ SOSY
40.0000 mg | PREFILLED_SYRINGE | INTRAMUSCULAR | Status: DC
  Administered 2022-06-15: 40 mg via SUBCUTANEOUS
  Filled 2022-06-15 (×2): qty 0.4

## 2022-06-15 MED ORDER — IPRATROPIUM-ALBUTEROL 0.5-2.5 (3) MG/3ML IN SOLN
3.0000 mL | Freq: Four times a day (QID) | RESPIRATORY_TRACT | Status: DC
  Administered 2022-06-16 (×2): 3 mL via RESPIRATORY_TRACT
  Filled 2022-06-15 (×2): qty 3

## 2022-06-15 MED ORDER — MAGNESIUM SULFATE 2 GM/50ML IV SOLN
2.0000 g | Freq: Once | INTRAVENOUS | Status: AC
  Administered 2022-06-15: 2 g via INTRAVENOUS
  Filled 2022-06-15 (×2): qty 50

## 2022-06-15 MED ORDER — ORAL CARE MOUTH RINSE: 15.0000 mL | OROMUCOSAL | Status: DC | PRN

## 2022-06-15 MED ORDER — POTASSIUM CHLORIDE CRYS ER 20 MEQ PO TBCR: 40.0000 meq | EXTENDED_RELEASE_TABLET | Freq: Once | ORAL | Status: DC

## 2022-06-15 MED ORDER — AMLODIPINE BESYLATE 10 MG PO TABS
10.0000 mg | ORAL_TABLET | Freq: Every day | ORAL | Status: DC
  Administered 2022-06-15 – 2022-06-16 (×2): 10 mg via ORAL
  Filled 2022-06-15: qty 2
  Filled 2022-06-15: qty 1

## 2022-06-15 MED ORDER — PREDNISONE 20 MG PO TABS
40.0000 mg | ORAL_TABLET | Freq: Every day | ORAL | Status: DC
  Administered 2022-06-15 – 2022-06-16 (×2): 40 mg via ORAL
  Filled 2022-06-15 (×2): qty 2

## 2022-06-15 MED ORDER — NICOTINE 21 MG/24HR TD PT24
21.0000 mg | MEDICATED_PATCH | Freq: Every day | TRANSDERMAL | Status: DC
  Filled 2022-06-15: qty 1

## 2022-06-15 MED ORDER — IPRATROPIUM-ALBUTEROL 0.5-2.5 (3) MG/3ML IN SOLN
3.0000 mL | RESPIRATORY_TRACT | Status: DC
  Administered 2022-06-15 (×3): 3 mL via RESPIRATORY_TRACT
  Filled 2022-06-15 (×4): qty 3

## 2022-06-15 NOTE — ED Notes (Signed)
Report given to [facility]. Will transport patient upstairs once room is clean.

## 2022-06-15 NOTE — Progress Notes (Signed)
Patient on room air with Sp02=96% and patient in no respiratory distress. Bipap on standby.

## 2022-06-15 NOTE — Hospital Course (Addendum)
Steven [name] is a 51 year old male with a history of COPD with severe bullous emphysema, HTN, and anxiety who presented with shortness of breath. His difficulty breathing had been progressing over the past three weeks and had attempted to use his at-home medications without relief. He felt like his difficulty breathing had become more easily triggered, such as with the heat coming on in his home.   Acute Hypercapnic Respiratory Failure Secondary to COPD Exacerbation Acute Respiratory Acidosis Severe Bullous Emphysema On arrival, patient was given an albuterol nebulizer and magnesium sulfate and was placed on BiPAP for respiratory support. He was noted to be acidotic on ABG. Patient's oxygen status quickly improved with therapy and he was able to transition to nasal cannula with oral prednisone and inhaler support. He was also started on azithromycin. ABG normalized on repeat testing. He quickly returned to baseline oxygen requirements and was stable at room air within the next day.   Tobacco Use Disorder Anxiety Patient stated that he feels as though his bupropion had been helping him with the anxiety he experiences during his dyspneic episodes. He had not received his bupropion recently and felt that may have contributed to his symptoms. He has a 31 pack-year smoking history, but has recently been able to reduce usage to 1-2 cigarettes/day with the help of bupropion. He reported feeling eager to stop smoking.   Prediabetes Random blood glucose >200 on arrival. Obtained A1c during admission - 5.9%  Hypertension Stable throughout hospital stay on amlodipine and losartan. HCTZ previously discontinued due to hyponatremia.  Erythrocytosis Patient has a history of erythrocytosis, thought to be secondary to his tobacco use disorder. Remained stable throughout stay. Morphology on blood smear was unremarkable.

## 2022-06-15 NOTE — ED Notes (Signed)
MD paged

## 2022-06-15 NOTE — ED Notes (Signed)
Reached out to MD in regards of respiratory status.

## 2022-06-15 NOTE — ED Notes (Signed)
Rounding team at bedside °

## 2022-06-15 NOTE — ED Notes (Signed)
ED TO INPATIENT HANDOFF REPORT  ED Nurse Name and Phone #: Malayla Granberry RN [phone]  S Name/Age/Gender Steven [name] 51 y.o. male Room/Bed: 007C/007C  Code Status   Code Status: Full Code  Home/SNF/Other Home Patient oriented to: self, place, time, and situation Is this baseline? Yes   Triage Complete: Triage complete  Chief Complaint COPD exacerbation [facility](HCC) [J44.1]  Triage Note Pt arrived by EMS from home complaining of shortness of breath x3 wks. Pt has been taking his at home meds and neb treatments but gets a lot of anxiety and is unable to finish treatments   EMS gave albuterol treatment and gave 125 solu-medrol.   Pt has diminished lungs sounds.    Allergies No Known Allergies  Level of Care/Admitting Diagnosis ED Disposition     ED Disposition  Admit   Condition  --   Comment  [facility] [[id number]]  Level of Care: Progressive [102]  Admit to Progressive based on following criteria: RESPIRATORY PROBLEMS hypoxemic/hypercapnic respiratory failure that is responsive to NIPPV (BiPAP) or High Flow Nasal Cannula (6-80 lpm). Frequent assessment/intervention, no > Q2 hrs < Q4 hrs, to maintain oxygenation and pulmonary hygiene.  [facility] if equivalent level of care is available:: No  Covid Evaluation: Asymptomatic - no recent exposure (last 10 days) testing not required  Diagnosis: COPD exacerbation [facility](HCC) [[id number]]  Admitting Physician: Dickie LaLAU, GRACE [[id number]]  Attending Physician: Dickie LaLAU, GRACE [[id number]]  Certification:: I certify this patient will need inpatient services for at least 2 midnights  Estimated Length of Stay: 4          B Medical/Surgery History Past Medical History:  Diagnosis Date   Asthma    COPD (chronic obstructive pulmonary disease) (HCC)    Hypertension    No past surgical history on file.   A IV Location/Drains/Wounds Patient Lines/Drains/Airways Status     Active  Line/Drains/Airways     Name Placement date Placement time Site Days   Peripheral IV 06/15/22 20 G Anterior;Left;Proximal Forearm 06/15/22  0145  Forearm  less than 1            Intake/Output Last 24 hours  Intake/Output Summary (Last 24 hours) at 06/15/2022 1751 Last data filed at 06/15/2022 0325 Gross per 24 hour  Intake 50 ml  Output --  Net 50 ml    Labs/Imaging Results for orders placed or performed during the hospital encounter of 06/15/22 (from the past 48 hour(s))  CBC with Differential     Status: Abnormal   Collection Time: 06/15/22  1:50 AM  Result Value Ref Range   WBC 10.3 4.0 - 10.5 K/uL   RBC 6.23 (H) 4.22 - 5.81 MIL/uL   Hemoglobin 17.4 (H) 13.0 - 17.0 g/dL   HCT 01.055.6 (H) 27.239.0 - 53.652.0 %   MCV 89.2 80.0 - 100.0 fL   MCH 27.9 26.0 - 34.0 pg   MCHC 31.3 30.0 - 36.0 g/dL   RDW 64.413.4 03.411.5 - 74.215.5 %   Platelets 305 150 - 400 K/uL   nRBC 0.0 0.0 - 0.2 %   Neutrophils Relative % 47 %   Neutro Abs 4.9 1.7 - 7.7 K/uL   Lymphocytes Relative 32 %   Lymphs Abs 3.3 0.7 - 4.0 K/uL   Monocytes Relative 12 %   Monocytes Absolute 1.2 (H) 0.1 - 1.0 K/uL   Eosinophils Relative 8 %   Eosinophils Absolute 0.8 (H) 0.0 - 0.5 K/uL   Basophils  Relative 1 %   Basophils Absolute 0.1 0.0 - 0.1 K/uL   Immature Granulocytes 0 %   Abs Immature Granulocytes 0.02 0.00 - 0.07 K/uL    Comment: Performed at [facility], [address] [place], [place] [address]  Basic metabolic panel     Status: Abnormal   Collection Time: 06/15/22  1:50 AM  Result Value Ref Range   Sodium 138 135 - 145 mmol/L   Potassium 3.9 3.5 - 5.1 mmol/L   Chloride 100 98 - 111 mmol/L   CO2 27 22 - 32 mmol/L   Glucose, Bld 202 (H) 70 - 99 mg/dL    Comment: Glucose reference range applies only to samples taken after fasting for at least 8 hours.   BUN 12 6 - 20 mg/dL   Creatinine, Ser 7.26 0.61 - 1.24 mg/dL   Calcium 8.9 8.9 - 20.3 mg/dL   GFR, Estimated >55 >97 mL/min    Comment: (NOTE) Calculated  using the CKD-EPI Creatinine Equation (2021)    Anion gap 11 5 - 15    Comment: Performed at [facility], [address] [place], [place] [address]  Technologist smear review     Status: None   Collection Time: 06/15/22  1:50 AM  Result Value Ref Range   WBC MORPHOLOGY MORPHOLOGY UNREMARKABLE    RBC MORPHOLOGY MORPHOLOGY UNREMARKABLE    Plt Morphology MORPHOLOGY UNREMARKABLE    Clinical Information erythrocytosis     Comment: Performed at [facility], [address] [place], [place] [address]  Resp Panel by RT-PCR (Flu A&B, Covid) Anterior Nasal Swab     Status: None   Collection Time: 06/15/22  1:52 AM   Specimen: Anterior Nasal Swab  Result Value Ref Range   SARS Coronavirus 2 by RT PCR NEGATIVE NEGATIVE    Comment: (NOTE) SARS-CoV-2 target nucleic acids are NOT DETECTED.  The SARS-CoV-2 RNA is generally detectable in upper respiratory specimens during the acute phase of infection. The lowest concentration of SARS-CoV-2 viral copies this assay can detect is 138 copies/mL. A negative result does not preclude SARS-Cov-2 infection and should not be used as the sole basis for treatment or other patient management decisions. A negative result may occur with  improper specimen collection/handling, submission of specimen other than nasopharyngeal swab, presence of viral mutation(s) within the areas targeted by this assay, and inadequate number of viral copies(<138 copies/mL). A negative result must be combined with clinical observations, patient history, and epidemiological information. The expected result is Negative.  Fact Sheet for Patients:  BloggerCourse.com  Fact Sheet for Healthcare Providers:  SeriousBroker.it  This test is no t yet approved or cleared by the [place] FDA and  has been authorized for detection and/or diagnosis of SARS-CoV-2 by FDA under an Emergency Use Authorization (EUA). This EUA will  remain  in effect (meaning this test can be used) for the duration of the COVID-19 declaration under Section 564(b)(1) of the Act, 21 U.S.C.section 360bbb-3(b)(1), unless the authorization is terminated  or revoked sooner.       Influenza A by PCR NEGATIVE NEGATIVE   Influenza B by PCR NEGATIVE NEGATIVE    Comment: (NOTE) The Xpert Xpress SARS-CoV-2/FLU/RSV plus assay is intended as an aid in the diagnosis of influenza from Nasopharyngeal swab specimens and should not be used as a sole basis for treatment. Nasal washings and aspirates are unacceptable for Xpert Xpress SARS-CoV-2/FLU/RSV testing.  Fact Sheet for Patients: BloggerCourse.com  Fact Sheet for Healthcare  Providers: SeriousBroker.it  This test is not yet approved or cleared by the [place] and has been authorized for detection and/or diagnosis of SARS-CoV-2 by FDA under an Emergency Use Authorization (EUA). This EUA will remain in effect (meaning this test can be used) for the duration of the COVID-19 declaration under Section 564(b)(1) of the Act, 21 U.S.C. section 360bbb-3(b)(1), unless the authorization is terminated or revoked.  Performed at [facility], [address] [place], [place] [address]   I-Stat arterial blood gas, ED [facility] ED only)     Status: Abnormal   Collection Time: 06/15/22  2:29 AM  Result Value Ref Range   pH, Arterial 7.213 (L) 7.35 - 7.45   pCO2 arterial 80.0 (HH) 32 - 48 mmHg   pO2, Arterial 185 (H) 83 - 108 mmHg   Bicarbonate 32.2 (H) 20.0 - 28.0 mmol/L   TCO2 35 (H) 22 - 32 mmol/L   O2 Saturation 99 %   Acid-Base Excess 1.0 0.0 - 2.0 mmol/L   Sodium 136 135 - 145 mmol/L   Potassium 3.4 (L) 3.5 - 5.1 mmol/L   Calcium, Ion 1.21 1.15 - 1.40 mmol/L   HCT 54.0 (H) 39.0 - 52.0 %   Hemoglobin 18.4 (H) 13.0 - 17.0 g/dL   Collection site RADIAL, ALLEN'S TEST ACCEPTABLE    Drawn by RT    Sample type ARTERIAL    Comment  NOTIFIED PHYSICIAN   HIV Antibody (routine testing w rflx)     Status: None   Collection Time: 06/15/22  4:57 AM  Result Value Ref Range   HIV Screen 4th Generation wRfx Non Reactive Non Reactive    Comment: Performed at [facility], [address], [place], [place] [address]  I-Stat arterial blood gas, ED     Status: Abnormal   Collection Time: 06/15/22  6:48 AM  Result Value Ref Range   pH, Arterial 7.377 7.35 - 7.45   pCO2 arterial 49.9 (H) 32 - 48 mmHg   pO2, Arterial 133 (H) 83 - 108 mmHg   Bicarbonate 29.6 (H) 20.0 - 28.0 mmol/L   TCO2 31 22 - 32 mmol/L   O2 Saturation 99 %   Acid-Base Excess 3.0 (H) 0.0 - 2.0 mmol/L   Sodium 139 135 - 145 mmol/L   Potassium 3.2 (L) 3.5 - 5.1 mmol/L   Calcium, Ion 1.18 1.15 - 1.40 mmol/L   HCT 53.0 (H) 39.0 - 52.0 %   Hemoglobin 18.0 (H) 13.0 - 17.0 g/dL   Patient temperature 23.5 F    Collection site RADIAL, ALLEN'S TEST ACCEPTABLE    Drawn by RT    Sample type ARTERIAL   Blood gas, venous     Status: Abnormal   Collection Time: 06/15/22  3:52 PM  Result Value Ref Range   pH, Ven 7.46 (H) 7.25 - 7.43   pCO2, Ven 48 44 - 60 mmHg   pO2, Ven 40 32 - 45 mmHg   Bicarbonate 34.1 (H) 20.0 - 28.0 mmol/L   Acid-Base Excess 8.8 (H) 0.0 - 2.0 mmol/L   O2 Saturation 75.1 %   Patient temperature 37.0    Collection site BLOOD LEFT ARM     Comment: Performed at [facility], [address], [place], [place] [address]   DG Chest Portable 1 View  Result Date: 06/15/2022 CLINICAL DATA:  Shortness of breath EXAM: PORTABLE CHEST 1 VIEW COMPARISON:  12/28/2021 FINDINGS: Emphysematous changes with large bullous changes in the upper  lobes, unchanged. No pleural effusion or pneumothorax. The heart is normal in size. IMPRESSION: Emphysematous changes with large bullous changes in the upper lobes, unchanged. No evidence of acute cardiopulmonary disease. Electronically Signed   By: Charline Bills M.D.   On: 06/15/2022 02:05    Pending  Labs Unresulted Labs (From admission, onward)     Start     Ordered   06/16/22 0500  Hemoglobin A1c  Tomorrow morning,   R        06/15/22 1436   06/15/22 0629  Blood gas, arterial  Once,   R        06/15/22 0629            Vitals/Pain Today's Vitals   06/15/22 1347 06/15/22 1555 06/15/22 1630 06/15/22 1700  BP:   130/82 134/86  Pulse: 86   94  Resp: (!) 24  (!) 26 (!) 28  Temp:  97.8 F (36.6 C)    TempSrc:  Oral    SpO2: 99%   98%  Weight:      Height:      PainSc:        Isolation Precautions Airborne and Contact precautions  Medications Medications  enoxaparin (LOVENOX) injection 40 mg (has no administration in time range)  ipratropium-albuterol (DUONEB) 0.5-2.5 (3) MG/3ML nebulizer solution 3 mL (3 mLs Nebulization Given 06/15/22 1212)  amLODipine (NORVASC) tablet 10 mg (10 mg Oral Given 06/15/22 1210)  losartan (COZAAR) tablet 25 mg (25 mg Oral Given 06/15/22 1210)  predniSONE (DELTASONE) tablet 40 mg (40 mg Oral Given 06/15/22 1210)  nicotine (NICODERM CQ - dosed in mg/24 hours) patch 21 mg (21 mg Transdermal Not Given 06/15/22 1351)  potassium chloride SA (KLOR-CON M) CR tablet 40 mEq (40 mEq Oral Not Given 06/15/22 1110)  fluticasone furoate-vilanterol (BREO ELLIPTA) 100-25 MCG/ACT 1 puff (has no administration in time range)  umeclidinium bromide (INCRUSE ELLIPTA) 62.5 MCG/ACT 1 puff (has no administration in time range)  azithromycin (ZITHROMAX) tablet 500 mg (has no administration in time range)  albuterol (PROVENTIL,VENTOLIN) solution continuous neb (15 mg/hr Nebulization Given 06/15/22 0223)  LORazepam (ATIVAN) injection 1 mg (1 mg Intravenous Given 06/15/22 0156)  magnesium sulfate IVPB 2 g 50 mL (0 g Intravenous Stopped 06/15/22 0325)    Mobility walks with person assist Low fall risk   Focused Assessments Cardiac Assessment Handoff:    No results found for: "CKTOTAL", "CKMB", "CKMBINDEX", "TROPONINI" No results found for: "DDIMER" Does the  Patient currently have chest pain? No   , Pulmonary Assessment Handoff:  Lung sounds: Bilateral Breath Sounds: Diminished L Breath Sounds: Diminished R Breath Sounds: Diminished O2 Device: Nasal Cannula O2 Flow Rate (L/min): 1 L/min    R Recommendations: See Admitting Provider Note  Report given to:   Additional Notes:

## 2022-06-15 NOTE — ED Triage Notes (Signed)
Pt arrived by EMS from home complaining of shortness of breath x3 wks. Pt has been taking his at home meds and neb treatments but gets a lot of anxiety and is unable to finish treatments   EMS gave albuterol treatment and gave 125 solu-medrol.   Pt has diminished lungs sounds.

## 2022-06-15 NOTE — ED Notes (Signed)
Trauma Event Note  Event Summary: Assisted Primary RN with intake of patient, assisted with initial triage, vital signs, and lab work.    Last imported Vital Signs BP (!) 155/101   Pulse (!) 113   Temp (!) 97.3 F (36.3 C) (Oral)   Resp (!) 26   Ht 5\' 10"  (1.778 m)   Wt 160 lb (72.6 kg)   SpO2 97%   BMI 22.96 kg/m   Trending CBC No results for input(s): "WBC", "HGB", "HCT", "PLT" in the last 72 hours.  Trending Coag's No results for input(s): "APTT", "INR" in the last 72 hours.  Trending BMET No results for input(s): "NA", "K", "CL", "CO2", "BUN", "CREATININE", "GLUCOSE" in the last 72 hours.     Trauma Response RN  Please call TRN at [phone] for further assistance.

## 2022-06-15 NOTE — H&P (Addendum)
Date: 06/15/2022               Patient Name:  Steven [name] MRN: [id number]  DOB: 1970-11-06 Age / Sex: 51 y.o., male   PCP: Belva Agee, MD         Medical Service: Internal Medicine Teaching Service         Attending Physician: Dr. Dickie La, MD      First Contact: Dr. Neldon Labella, MD Pager [phone]    Second Contact: Dr. Champ Mungo, DO Pager [phone]         After Hours (After 5p/  First Contact Pager: [phone]  weekends / holidays): Second Contact Pager: [phone]   SUBJECTIVE   Chief Complaint: Shortness of breath  History of Present Illness:   Steven [name] is a 51 y/o person living with a history of HTN, COPD (no prior PFT's), severe bullous emphysema, and tobacco use disorder who presents with worsening shortness of breath for the last week that persisted yesterday. He had a recent hospitalization 12/2021 for a COPD exacerbation. This improved after bipap therapy, IV steroids, and continued his home LABA/LAMA/ICS.  He followed up with [facility] and a referral to pulmonology was placed however he never returned the calls and the referral closed. Since then he has had intermittent episodes of shortness of breath that resolve with home inhalers and duonebs.   Prior to arrival he had worsening dyspnea that did not resolve with home therapies. This prompted him to call EMS and come to the ED. Associated symptoms of a productive cough with minimal sputum. He denies any exacerbating features. He denies fever, chills, chest pain, abdominal pain, n/v/d, difficulties urinating. Endorses continued use of inhaler (he shakes head yes when asked about fluticasone, however, last clinic note mentions breztri inhaler). He does continue to smoke tobacco products, 1PPD since he was 51 y/o. On our encounter he has significant improvement in his dyspnea and is still on BiPAP.   Meds:  Losartan 25 mg daily Amlodipine 10 mg daily Fluticasone inhaler two times a day  Breztri on medication list,  unclear if taking  Past Medical History HTN COPD (no PFT's) Tobacco use disorder  Social:  Lives With: Lives with wife Occupation: Maintenance Support: Wife in area Level of Function: All ADLs/IADLs can be done PCP:Dr. Katsadorous, [facility] Substances: 1-2 drinks every month or so. Has history fo 12 beers per day for many years, he is unclear for how long he did this. He continues to smoke cigarettes about a pack a day. 30 pack year history   Family History:  None listed.   Allergies: Allergies as of 06/15/2022   (No Known Allergies)   Review of Systems: A complete ROS was negative except as per HPI.   OBJECTIVE:   Physical Exam: Blood pressure (!) 120/92, pulse (!) 127, temperature (!) 97.3 F (36.3 C), temperature source Oral, resp. rate (!) 30, height 5\' 10"  (1.778 m), weight 72.6 kg, SpO2 100 %.  Constitutional: no acute distress HENT: normocephalic atraumatic, mucous membranes moist Eyes: conjunctiva non-erythematous Neck: supple Cardiovascular: regular rate and rhythm, no m/r/g Pulmonary/Chest: normal work of breathing on bipap. Diffuse wheezing. Minimal accessory muscle use.  Abdominal: soft, non-tender, non-distended MSK: normal bulk and tone Neurological: alert & oriented x 3 Skin: warm and dry Psych: normal mood  Labs: CBC    Component Value Date/Time   WBC 10.3 06/15/2022 0150   RBC 6.23 (H) 06/15/2022 0150   HGB 18.4 (H) 06/15/2022 0229   HGB 15.5 01/06/2022  1434   HCT 54.0 (H) 06/15/2022 0229   HCT 44.9 01/06/2022 1434   PLT 305 06/15/2022 0150   PLT 340 01/06/2022 1434   MCV 89.2 06/15/2022 0150   MCV 82 01/06/2022 1434   MCH 27.9 06/15/2022 0150   MCHC 31.3 06/15/2022 0150   RDW 13.4 06/15/2022 0150   RDW 12.1 01/06/2022 1434   LYMPHSABS 3.3 06/15/2022 0150   MONOABS 1.2 (H) 06/15/2022 0150   EOSABS 0.8 (H) 06/15/2022 0150   BASOSABS 0.1 06/15/2022 0150     CMP     Component Value Date/Time   NA 136 06/15/2022 0229   NA 135 01/06/2022  1434   K 3.4 (L) 06/15/2022 0229   CL 100 06/15/2022 0150   CO2 27 06/15/2022 0150   GLUCOSE 202 (H) 06/15/2022 0150   BUN 12 06/15/2022 0150   BUN 10 01/06/2022 1434   CREATININE 1.06 06/15/2022 0150   CALCIUM 8.9 06/15/2022 0150   GFRNONAA >60 06/15/2022 0150   Imaging: Chest xray - Upper lobes with evidence of bullous and emphysematous changes  EKG: personally reviewed my interpretation is sinus tachycardia at 115 beats per minute. Normal axis and segments.  Prior EKG 12/2021 without acute changes  ASSESSMENT & PLAN:   Assessment & Plan by Problem: Principal Problem:   COPD exacerbation [facility])  Steven [name] is a 51 y/o person living with a history of HTN, COPD (no prior PFT's), severe bullous emphysema, and tobacco use disorder who presents with worsening shortness of breath for the last week that persisted yesterday and admitted for acute hypercarbic respiratory failure 2/2 COPD exacerbation  Acute hypercarbic respiratory failure 2/2 COPD exacerbation Acute respiratory acidosis Severe bullous emphysema History of suspected COPD, has not had PFT's done. Has been adherence to his home regimen, but continues to use tobacco products. Presented after 3 weeks of worsening dyspnea that eventually did not improve with home therapy. He required BiPAP on admission w/ IV solumedrol and IV mag. ABG with pH of 7.2 and PCO2 of 80. Last PCO02 of 60's. Appears to be acutely compensating. Has significant improvement on our examination, will try to wean off of BiPAP and repeat ABG. Continue steroids and home medication regimen of LABA/LAMA/ICS. Low suspicion for infectious or cardiac etiology.  -continue duonebs every 4 hours -prednisone 40 mg, day 1/4 -budeson-glycopyrrol-formoterol daily -needs outpatient PFT's and pulmonology follow up  Hypertension Continue home amlodipine 10 mg and losartan 25 mg daily. HCTZ discontinued in the past with hyponatremia  Erythrocytosis Hx of erythrocytosis  thought to be secondary to his tobacco use disorder. -blood smear pending  Tobacco use disorder Continue to encourage cessation. Chantix appears on medication list however he denies taking this. Would suggest following up at discharge to help with cessation.   Diet: NPO, start diet once off BiPAP VTE: Enoxaparin IVF: None,None Code: Full  Prior to Admission Living Arrangement: Home, living wife Anticipated Discharge Location: Home Barriers to Discharge: Improvement in dyspena  Dispo: Admit patient to Inpatient with expected length of stay greater than 2 midnights.  Signed: Riesa Pope, MD Internal Medicine Resident PGY-3  06/15/2022, 6:32 AM

## 2022-06-15 NOTE — ED Provider Notes (Addendum)
[facility] EMERGENCY DEPARTMENT Provider Note   CSN: [id number] Arrival date & time: 06/15/22  0147     History  Chief Complaint  Patient presents with   Shortness of Breath    Steven [name] is a 51 y.o. male.  HPI     This is a 51 year old male brought in by EMS with concerns for shortness of breath.  Per EMS his had 3 weeks of persistent and worsening shortness of breath.  He has been using his home meds and nebulizers but often will not finish his nebulizer treatment secondary to anxiety and palpitations.  He has had a cough but no fevers at home.  He denies chest pain.  EMS notes that his O2 sat was 93% on room air.  He was given Solu-Medrol and a DuoNeb in route.    Home Medications Prior to Admission medications   Medication Sig Start Date End Date Taking? Authorizing Provider  acetaminophen (TYLENOL) 500 MG tablet Take 2 tablets (1,000 mg total) by mouth every 6 (six) hours as needed for moderate pain or headache. 05/04/22   Steven Blamer, DO  amLODipine (NORVASC) 10 MG tablet Take 1 tablet (10 mg total) by mouth daily. 12/30/21 01/29/22  Steven Gordon, DO  Budeson-Glycopyrrol-Formoterol (BREZTRI AEROSPHERE) 160-9-4.8 MCG/ACT AERO Inhale 2 puffs into the lungs in the morning and at bedtime. 01/06/22   Steven Manes, MD  buPROPion (WELLBUTRIN XL) 150 MG 24 hr tablet Take 1 tablet (150 mg total) by mouth daily. 12/30/21 01/29/22  Steven Gordon, DO  cetirizine (ZYRTEC) 10 MG tablet Take 1 tablet (10 mg total) by mouth daily. 05/04/22   Steven Blamer, DO  fluticasone [facility]) 50 MCG/ACT nasal spray Place 1 spray into both nostrils daily. 05/04/22   Steven Blamer, DO  fluticasone-salmeterol (ADVAIR) 100-50 MCG/ACT AEPB Inhale 1 puff into the lungs 2 (two) times daily. 05/04/22   Steven Blamer, DO  hydrochlorothiazide (HYDRODIURIL) 25 MG tablet Take 1 tablet (25 mg total) by mouth daily. 05/04/22 06/03/22  Steven Blamer, DO  ipratropium (ATROVENT) 0.02 % nebulizer  solution USe 1 vial (0.5 mg total) by nebulization every 4 (four) hours as needed for wheezing or shortness of breath. 12/30/21   Steven Gordon, DO  ipratropium (ATROVENT) 0.02 % nebulizer solution Take 2.5 mLs (0.5 mg total) by nebulization 2 (two) times daily and every 4 hours as needed 05/04/22   Steven Blamer, DO  losartan (COZAAR) 25 MG tablet Take 1 tablet (25 mg total) by mouth daily. 05/26/22 06/25/22  Steven Ochs, MD  varenicline (CHANTIX) 0.5 MG tablet Take 1 tab once daily for days 1-3. Take 1 tab twice daily for days 4-7. Take 2 tabs twice daily every day starting day 8. 12/30/21   Steven Gordon, DO  varenicline (CHANTIX) 0.5 MG tablet Take 1 tablet (0.5 mg total) by mouth 2 (two) times daily. 05/04/22   Steven Blamer, DO      Allergies    Patient has no known allergies.    Review of Systems   Review of Systems  Constitutional:  Negative for fever.  Respiratory:  Positive for cough, shortness of breath and wheezing.   Cardiovascular:  Negative for chest pain.  All other systems reviewed and are negative.   Physical Exam Updated Vital Signs BP (!) 120/92   Pulse (!) 127   Temp (!) 97.3 F (36.3 C) (Oral)   Resp (!) 30   Ht 1.778 m (5\' 10" )   Wt 72.6 kg   SpO2 100%  BMI 22.96 kg/m  Physical Exam Vitals and nursing note reviewed.  Constitutional:      Appearance: He is well-developed. He is ill-appearing. He is not toxic-appearing.  HENT:     Head: Normocephalic and atraumatic.  Eyes:     Pupils: Pupils are equal, round, and reactive to light.  Cardiovascular:     Rate and Rhythm: Regular rhythm. Tachycardia present.     Heart sounds: Normal heart sounds. No murmur heard. Pulmonary:     Effort: Respiratory distress present.     Breath sounds: Wheezing present.     Comments: Tripoding, tachypneic, diminished breath sounds in all lung fields with end expiratory squeaks noted Abdominal:     General: Bowel sounds are normal.     Palpations: Abdomen is soft.      Tenderness: There is no abdominal tenderness. There is no rebound.  Musculoskeletal:     Cervical back: Neck supple.     Right lower leg: No edema.     Left lower leg: No edema.  Lymphadenopathy:     Cervical: No cervical adenopathy.  Skin:    General: Skin is warm and dry.  Neurological:     Mental Status: He is alert and oriented to person, place, and time.  Psychiatric:        Mood and Affect: Mood normal.     ED Results / Procedures / Treatments   Labs (all labs ordered are listed, but only abnormal results are displayed) Labs Reviewed  CBC WITH DIFFERENTIAL/PLATELET - Abnormal; Notable for the following components:      Result Value   RBC 6.23 (*)    Hemoglobin 17.4 (*)    HCT 55.6 (*)    Monocytes Absolute 1.2 (*)    Eosinophils Absolute 0.8 (*)    All other components within normal limits  BASIC METABOLIC PANEL - Abnormal; Notable for the following components:   Glucose, Bld 202 (*)    All other components within normal limits  I-STAT ARTERIAL BLOOD GAS, ED - Abnormal; Notable for the following components:   pH, Arterial 7.213 (*)    pCO2 arterial 80.0 (*)    pO2, Arterial 185 (*)    Bicarbonate 32.2 (*)    TCO2 35 (*)    Potassium 3.4 (*)    HCT 54.0 (*)    Hemoglobin 18.4 (*)    All other components within normal limits  RESP PANEL BY RT-PCR (FLU A&B, COVID) [id number]    EKG EKG Interpretation  Date/Time:  Wednesday June 15 2022 02:01:39 EST Ventricular Rate:  119 PR Interval:  153 QRS Duration: 96 QT Interval:  328 QTC Calculation: 462 R Axis:   -1 Text Interpretation: Sinus tachycardia Atrial premature complex LAE, consider biatrial enlargement Anteroseptal infarct, age indeterminate Confirmed by Ross Marcus ([id number]) on 06/15/2022 2:38:46 AM  Radiology DG Chest Portable 1 View  Result Date: 06/15/2022 CLINICAL DATA:  Shortness of breath EXAM: PORTABLE CHEST 1 VIEW COMPARISON:  12/28/2021 FINDINGS: Emphysematous changes with large bullous  changes in the upper lobes, unchanged. No pleural effusion or pneumothorax. The heart is normal in size. IMPRESSION: Emphysematous changes with large bullous changes in the upper lobes, unchanged. No evidence of acute cardiopulmonary disease. Electronically Signed   By: Charline Bills M.D.   On: 06/15/2022 02:05    Procedures .Critical Care  Performed by: Shon Baton, MD Authorized by: Shon Baton, MD   Critical care provider statement:    Critical care time (minutes):  60   Critical  care was necessary to treat or prevent imminent or life-threatening deterioration of the following conditions:  Respiratory failure   Critical care was time spent personally by me on the following activities:  Development of treatment plan with patient or surrogate, discussions with consultants, evaluation of patient's response to treatment, examination of patient, ordering and review of laboratory studies, ordering and review of radiographic studies, ordering and performing treatments and interventions, pulse oximetry, re-evaluation of patient's condition and review of old charts     Medications Ordered in ED Medications  albuterol (PROVENTIL,VENTOLIN) solution continuous neb (15 mg/hr Nebulization Given 06/15/22 0223)  LORazepam (ATIVAN) injection 1 mg (1 mg Intravenous Given 06/15/22 0156)  magnesium sulfate IVPB 2 g 50 mL (2 g Intravenous New Bag/Given 06/15/22 0220)    ED Course/ Medical Decision Making/ A&P Clinical Course as of 06/15/22 0407  Wed Jun 15, 2022  0403 More comfortable on BiPAP.  Aeration improving. [CH]    Clinical Course User Index [CH] Deanndra Kirley, Barbette Hair, MD                           Medical Decision Making Amount and/or Complexity of Data Reviewed Labs: ordered. Radiology: ordered.  Risk Prescription drug management. Decision regarding hospitalization.   This patient presents to the ED for concern of shortness of breath, this involves an extensive number of  treatment options, and is a complaint that carries with it a high risk of complications and morbidity.  I considered the following differential and admission for this acute, potentially life threatening condition.  The differential diagnosis includes pneumonia, COPD exacerbation, viral illness, heart failure  MDM:    This is a 51 year old male who presents with shortness of breath.  History of fairly significant COPD.  Potting and in respiratory distress on my evaluation.  He is very tight with only an end expiratory wheeze.  Requested BiPAP.  Patient states he has tolerated this previously.  I did give him a small dose of Ativan and started continuous DuoNeb.  He received Solu-Medrol by EMS.  Additionally, he was given 2 g of magnesium.  Labs obtained and reviewed.  ABG with a pH of 7.21 and a PCO2 of 80 consistent with respiratory acidosis.  COVID and influenza negative.  No further leukocytosis or significant metabolic derangements.  Chest x-ray without evidence of pneumonia or pneumothorax.  Consistent with emphysema.  Additionally, EKG is without acute ischemic arrhythmic changes.  He is significantly tachycardic.  He does not have any active chest pain.  Doubt ACS.  On recheck, he is overall improved.  However, feel he needs admission to the hospital for ongoing nebs and respiratory support. (Labs, imaging, consults)  Labs: I Ordered, and personally interpreted labs.  The pertinent results include: ABG, CBC, BMP  Imaging Studies ordered: I ordered imaging studies including chest x-ray I independently visualized and interpreted imaging. I agree with the radiologist interpretation  Additional history obtained from EMS.  External records from outside source obtained and reviewed including prior admissions  Cardiac Monitoring: The patient was maintained on a cardiac monitor.  I personally viewed and interpreted the cardiac monitored which showed an underlying rhythm of: Sinus  tachycardia  Reevaluation: After the interventions noted above, I reevaluated the patient and found that they have :improved  Social Determinants of Health:  lives independently  Disposition: admit  Co morbidities that complicate the patient evaluation  Past Medical History:  Diagnosis Date   Asthma    COPD (  chronic obstructive pulmonary disease) (HCC)    Hypertension      Medicines Meds ordered this encounter  Medications   albuterol (PROVENTIL,VENTOLIN) solution continuous neb   LORazepam (ATIVAN) injection 1 mg   magnesium sulfate IVPB 2 g 50 mL    I have reviewed the patients home medicines and have made adjustments as needed  Problem List / ED Course: Problem List Items Addressed This Visit       Respiratory   COPD exacerbation ([place])   Other Visit Diagnoses     Acute on chronic respiratory failure with hypercapnia ([place])    -  Primary                    Final Clinical Impression(s) / ED Diagnoses Final diagnoses:  Acute on chronic respiratory failure with hypercapnia ([place])  COPD exacerbation ([place])    Rx / DC Orders ED Discharge Orders     None         Issiah Huffaker, Barbette Hair, MD 06/15/22 0407    Merryl Hacker, MD 06/15/22 [phone]

## 2022-06-16 ENCOUNTER — Other Ambulatory Visit (HOSPITAL_COMMUNITY): Payer: Self-pay

## 2022-06-16 ENCOUNTER — Telehealth (HOSPITAL_COMMUNITY): Payer: Self-pay

## 2022-06-16 DIAGNOSIS — J441 Chronic obstructive pulmonary disease with (acute) exacerbation: Secondary | ICD-10-CM | POA: Diagnosis not present

## 2022-06-16 LAB — CBC WITH DIFFERENTIAL/PLATELET
Abs Immature Granulocytes: 0.06 10*3/uL (ref 0.00–0.07)
Basophils Absolute: 0 10*3/uL (ref 0.0–0.1)
Basophils Relative: 0 %
Eosinophils Absolute: 0.1 10*3/uL (ref 0.0–0.5)
Eosinophils Relative: 0 %
HCT: 47.3 % (ref 39.0–52.0)
Hemoglobin: 15.6 g/dL (ref 13.0–17.0)
Immature Granulocytes: 0 %
Lymphocytes Relative: 11 %
Lymphs Abs: 1.4 10*3/uL (ref 0.7–4.0)
MCH: 28.4 pg (ref 26.0–34.0)
MCHC: 33 g/dL (ref 30.0–36.0)
MCV: 86 fL (ref 80.0–100.0)
Monocytes Absolute: 1.1 10*3/uL — ABNORMAL HIGH (ref 0.1–1.0)
Monocytes Relative: 9 %
Neutro Abs: 10.7 10*3/uL — ABNORMAL HIGH (ref 1.7–7.7)
Neutrophils Relative %: 80 %
Platelets: 273 10*3/uL (ref 150–400)
RBC: 5.5 MIL/uL (ref 4.22–5.81)
RDW: 13.2 % (ref 11.5–15.5)
WBC: 13.4 10*3/uL — ABNORMAL HIGH (ref 4.0–10.5)
nRBC: 0 % (ref 0.0–0.2)

## 2022-06-16 LAB — HEMOGLOBIN A1C
Hgb A1c MFr Bld: 5.9 % — ABNORMAL HIGH (ref 4.8–5.6)
Mean Plasma Glucose: 122.63 mg/dL

## 2022-06-16 LAB — BASIC METABOLIC PANEL
Anion gap: 10 (ref 5–15)
BUN: 17 mg/dL (ref 6–20)
CO2: 28 mmol/L (ref 22–32)
Calcium: 8.8 mg/dL — ABNORMAL LOW (ref 8.9–10.3)
Chloride: 96 mmol/L — ABNORMAL LOW (ref 98–111)
Creatinine, Ser: 1.06 mg/dL (ref 0.61–1.24)
GFR, Estimated: >60 mL/min (ref >60)
Glucose, Bld: 95 mg/dL (ref 70–99)
Potassium: 3.3 mmol/L — ABNORMAL LOW (ref 3.5–5.1)
Sodium: 134 mmol/L — ABNORMAL LOW (ref 135–145)

## 2022-06-16 MED ORDER — AZITHROMYCIN 250 MG PO TABS
ORAL_TABLET | ORAL | 0 refills | Status: DC
  Filled 2022-06-16: qty 2, 1d supply, fill #0

## 2022-06-16 MED ORDER — ALBUTEROL SULFATE HFA 108 (90 BASE) MCG/ACT IN AERS
2.0000 | INHALATION_SPRAY | Freq: Four times a day (QID) | RESPIRATORY_TRACT | 2 refills | Status: AC | PRN
  Filled 2022-06-16: qty 6.7, 25d supply, fill #0
  Filled 2022-07-27: qty 6.7, 25d supply, fill #1

## 2022-06-16 MED ORDER — UMECLIDINIUM BROMIDE 62.5 MCG/ACT IN AEPB
1.0000 | INHALATION_SPRAY | Freq: Every day | RESPIRATORY_TRACT | 0 refills | Status: DC
  Filled 2022-06-16: qty 30, 30d supply, fill #0

## 2022-06-16 MED ORDER — PREDNISONE 20 MG PO TABS
40.0000 mg | ORAL_TABLET | Freq: Every day | ORAL | 0 refills | Status: DC
  Filled 2022-06-16: qty 4, 2d supply, fill #0

## 2022-06-16 NOTE — Discharge Summary (Addendum)
Name: Steven [name] MRN: [id number] DOB: May 16, 1971 51 y.o. PCP: Riesa Pope, MD  Date of Admission: 06/15/2022  1:47 AM Date of Discharge:  06/16/2022 Attending Physician: Dr.  Charise Killian  DISCHARGE DIAGNOSIS:  Primary Problem: COPD exacerbation [facility])   Hospital Problems: Principal Problem:   COPD exacerbation (Portal)    DISCHARGE MEDICATIONS:   Allergies as of 06/16/2022   No Known Allergies      Medication List     STOP taking these medications    Breztri Aerosphere 160-9-4.8 MCG/ACT Aero Generic drug: Budeson-Glycopyrrol-Formoterol   hydrochlorothiazide 25 MG tablet Commonly known as: HYDRODIURIL       TAKE these medications    acetaminophen 500 MG tablet Commonly known as: TYLENOL Take 2 tablets (1,000 mg total) by mouth every 6 (six) hours as needed for moderate pain or headache.   albuterol 108 (90 Base) MCG/ACT inhaler Commonly known as: VENTOLIN HFA Inhale 2 puffs into the lungs every 6 (six) hours as needed for wheezing or shortness of breath.   amLODipine 10 MG tablet Commonly known as: NORVASC Take 1 tablet (10 mg total) by mouth daily.   azithromycin 250 MG tablet Commonly known as: ZITHROMAX Take two tablets by mouth on Friday, 11/17, for one day. Start taking on: June 17, 2022   buPROPion 150 MG 24 hr tablet Commonly known as: WELLBUTRIN XL Take 1 tablet (150 mg total) by mouth daily.   cetirizine 10 MG tablet Commonly known as: ZYRTEC Take 1 tablet (10 mg total) by mouth daily.   fluticasone 50 MCG/ACT nasal spray Commonly known as: FLONASE Place 1 spray into both nostrils daily.   fluticasone-salmeterol 100-50 MCG/ACT Aepb Commonly known as: ADVAIR Inhale 1 puff into the lungs 2 (two) times daily.   ipratropium 0.02 % nebulizer solution Commonly known as: ATROVENT USe 1 vial (0.5 mg total) by nebulization every 4 (four) hours as needed for wheezing or shortness of breath.   ipratropium 0.02 % nebulizer  solution Commonly known as: ATROVENT Take 2.5 mLs (0.5 mg total) by nebulization 2 (two) times daily and every 4 hours as needed   losartan 25 MG tablet Commonly known as: COZAAR Take 1 tablet (25 mg total) by mouth daily.   predniSONE 20 MG tablet Commonly known as: DELTASONE Take 2 tablets (40 mg total) by mouth daily with breakfast. Start taking on: June 17, 2022   umeclidinium bromide 62.5 MCG/ACT Aepb Commonly known as: INCRUSE ELLIPTA Inhale 1 puff into the lungs daily.   varenicline 0.5 MG tablet Commonly known as: CHANTIX Take 1 tab once daily for days 1-3. Take 1 tab twice daily for days 4-7. Take 2 tabs twice daily every day starting day 8.   varenicline 0.5 MG tablet Commonly known as: Chantix Take 1 tablet (0.5 mg total) by mouth 2 (two) times daily.        DISPOSITION AND FOLLOW-UP:  Steven [name] was discharged from [facility] in Stable condition. At the hospital follow up visit please address:  Acute Hypercapnic Respiratory Failure Secondary to COPD Exacerbation Acute Respiratory Acidosis Severe Bullous Emphysema Ensure patient is able to obtain incruse ellipta prior to running out of the remaining medication he took home with him. Please work with him once he obtains new insurance to optimize medical therapy. Patient needs PFTs and pulmonology referral in addition to pulmonary rehab.  Tobacco Use Disorder Anxiety Discuss anxiety triggers and adjust medication accordingly. Restarted on bupropion while admitted. May benefit from referral to CBT.  Follow-up  Recommendations: Consults: Pulmonary medicine Labs: Hgb A1C Studies: Pulmonary Function Tests Medications:    New at Discharge: Albuterol (Ventolin) rescue inhaler 2 puffs q6H PRN Incruse Ellipta 1 puff daily Azithromycin 250mg  two tablets on Friday Prednisone 20mg  two tablets daily with breakfast for 2 more days Other Changes: Restarted Bupropion 150mg  daily Continue home  Advair No other changes, continue home medications  Follow-up Appointments:  Follow-up Information     Riesa Pope, MD Follow up.   Specialty: Internal Medicine Why: Our clinic will contact you to schedule a follow up appointment. Contact information: [place] [address] [phone]                 HOSPITAL COURSE:  Patient Summary: Steven [name] is a 51 year old male with a history of COPD with severe bullous emphysema, HTN, and anxiety who presented with shortness of breath. His difficulty breathing had been progressing over the past three weeks and had attempted to use his at-home medications without relief. He felt like his difficulty breathing had become more easily triggered, such as with the heat coming on in his home.   Acute Hypercapnic Respiratory Failure Secondary to COPD Exacerbation Acute Respiratory Acidosis Severe Bullous Emphysema On arrival, patient was given an albuterol nebulizer and magnesium sulfate and was placed on BiPAP for respiratory support. He was noted to be acidotic on ABG. Patient's oxygen status quickly improved with therapy and he was able to transition to nasal cannula with oral prednisone and inhaler support. He was also started on azithromycin. ABG normalized on repeat testing. He quickly returned to baseline oxygen requirements and was stable at room air within the next day.   Tobacco Use Disorder Anxiety Patient stated that he feels as though his bupropion had been helping him with the anxiety he experiences during his dyspneic episodes. He had not received his bupropion recently and felt that may have contributed to his symptoms. He has a 31 pack-year smoking history, but has recently been able to reduce usage to 1-2 cigarettes/day with the help of bupropion. He reported feeling eager to stop smoking.   Prediabetes Random blood glucose >200 on arrival. Obtained A1c during admission - 5.9%  Hypertension Stable  throughout hospital stay on amlodipine and losartan. HCTZ previously discontinued due to hyponatremia.  Erythrocytosis Patient has a history of erythrocytosis, thought to be secondary to his tobacco use disorder. Remained stable throughout stay. Morphology on blood smear was unremarkable.   DISCHARGE INSTRUCTIONS:   Discharge Instructions     Discharge instructions   Complete by: As directed    Steven [name],  It was a pleasure to care for you during your stay at [facility] were admitted for a COPD exacerbation and are doing much better!  You are going home with Incruse Ellipta to continue using at home. A prescription for additional fills has been sent to the pharmacy; please make sure you pick this up before running out of the supply you are going home with! Continue using your home Advair as regularly prescribed. You are also being discharged with an albuterol rescue inhaler. Use your rescue inhaler when you begin to feel an acute flare.  You will also have one more day of azithromycin and two more days of prednisone therapy. Make sure you finish these medication courses.  The clinic will call you to schedule a follow-up visit.  My best, Dr. Marlou Sa   Increase activity slowly   Complete by: As directed  SUBJECTIVE:  Patient was resting comfortably on exam. Oxygen saturation had remained stable on room air overnight and he denied having any difficulty breathing. He stated he could notice some of his wheezing on exam. He has a cough at baseline and it was not worse than usual. He denied tightness in his chest and stated his anxiety had significantly improved. He is eager to go home and is motivated to stop smoking and work on his health.   Discharge Vitals:   BP 101/66 (BP Location: Left Arm)   Pulse 89   Temp (!) 97.5 F (36.4 C) (Oral)   Resp 18   Ht 5\' 10"  (1.778 m)   Wt 71.2 kg   SpO2 92%   BMI 22.53 kg/m   OBJECTIVE:  Physical Exam Constitutional:       General: He is not in acute distress.    Appearance: He is normal weight.  HENT:     Head: Normocephalic and atraumatic.  Pulmonary:     Effort: Pulmonary effort is normal. No accessory muscle usage.     Breath sounds: Examination of the right-lower field reveals wheezing. Examination of the left-lower field reveals wheezing. Wheezing (improved from yesterday) present. No rhonchi or rales.  Skin:    General: Skin is warm and dry.     Nails: There is no clubbing.  Neurological:     Mental Status: He is alert and oriented to person, place, and time.  Psychiatric:        Mood and Affect: Mood normal.      Pertinent Labs, Studies, and Procedures:     Latest Ref Rng & Units 06/16/2022    6:34 AM 06/15/2022    6:48 AM 06/15/2022    2:29 AM  CBC  WBC 4.0 - 10.5 K/uL 13.4     Hemoglobin 13.0 - 17.0 g/dL 06/17/2022  10.9  32.3   Hematocrit 39.0 - 52.0 % 47.3  53.0  54.0   Platelets 150 - 400 K/uL 273          Latest Ref Rng & Units 06/16/2022    6:34 AM 06/15/2022    6:48 AM 06/15/2022    2:29 AM  CMP  Glucose 70 - 99 mg/dL 95     BUN 6 - 20 mg/dL 17     Creatinine 06/17/2022 - 1.24 mg/dL 3.22     Sodium 0.25 - 427 mmol/L 134  139  136   Potassium 3.5 - 5.1 mmol/L 3.3  3.2  3.4   Chloride 98 - 111 mmol/L 96     CO2 22 - 32 mmol/L 28     Calcium 8.9 - 10.3 mg/dL 8.8       DG Chest Portable 1 View  Result Date: 06/15/2022 CLINICAL DATA:  Shortness of breath EXAM: PORTABLE CHEST 1 VIEW COMPARISON:  12/28/2021 FINDINGS: Emphysematous changes with large bullous changes in the upper lobes, unchanged. No pleural effusion or pneumothorax. The heart is normal in size. IMPRESSION: Emphysematous changes with large bullous changes in the upper lobes, unchanged. No evidence of acute cardiopulmonary disease. Electronically Signed   By: 12/30/2021 M.D.   On: 06/15/2022 02:05     Signed: 06/17/2022, D.O.  Internal Medicine Resident, PGY-2 [facility]  Pager:  [phone]

## 2022-06-16 NOTE — TOC Benefit Eligibility Note (Signed)
Patient Product/process development scientist completed.    The patient is currently admitted and upon discharge could be taking Anoro Ellipta.  The current 30 day co-pay is $145.83.    The patient is currently admitted and upon discharge could be taking Anoro Ellipta.  The current 30 day co-pay is $145.83.   The patient is insured through Courtney Heys, CP[employer] Pharmacy Patient Advocate Specialist [facility] Pharm[facility] Patient Advocate Team Direct Number: [phone] Fax: [phone]

## 2022-06-16 NOTE — TOC Progression Note (Signed)
Transition of Care [facility]) - Progression Note    Patient Details  Name: Steven [name] MRN: [id number] Date of Birth: 11-04-1970  Transition of Care [facility]) CM/SW Contact  Leone Haven, RN Phone Number: 06/16/2022, 12:13 PM  Clinical Narrative:    from home with spouse, copd ex, emphysema, sinus tach,  conts on po abx, po steroids. 1 liter [place], has home oxygen.   TOC following.        Expected Discharge Plan and Services                                                 Social Determinants of Health (SDOH) Interventions    Readmission Risk Interventions     No data to display

## 2022-06-16 NOTE — Telephone Encounter (Signed)
Pharmacy Patient Advocate Encounter  Insurance verification completed.    The patient is insured through [place]   The patient is currently admitted and ran test claims for the following: Incruse Ellipta, Anoro Ellipta.  Copays and coinsurance results were relayed to Inpatient clinical team.

## 2022-06-16 NOTE — TOC Transition Note (Signed)
Transition of Care [facility]) - CM/SW Discharge Note   Patient Details  Name: Steven [name] MRN: [id number] Date of Birth: 06/25/1971  Transition of Care [facility]) CM/SW Contact:  Leone Haven, RN Phone Number: 06/16/2022, 4:19 PM   Clinical Narrative:    Patient is for dc ,has no needs.          Patient Goals and CMS Choice        Discharge Placement                       Discharge Plan and Services                                     Social Determinants of Health (SDOH) Interventions     Readmission Risk Interventions     No data to display

## 2022-06-17 ENCOUNTER — Other Ambulatory Visit (HOSPITAL_COMMUNITY): Payer: Self-pay

## 2022-06-25 ENCOUNTER — Other Ambulatory Visit: Payer: Self-pay

## 2022-06-25 ENCOUNTER — Encounter (HOSPITAL_COMMUNITY): Payer: Self-pay | Admitting: Emergency Medicine

## 2022-06-25 ENCOUNTER — Inpatient Hospital Stay (HOSPITAL_COMMUNITY)
Admission: EM | Admit: 2022-06-25 | Discharge: 2022-06-27 | DRG: 192 | Disposition: A | Discharge status: Home / Self Care | Payer: Commercial Managed Care - HMO | Attending: Infectious Diseases | Admitting: Infectious Diseases

## 2022-06-25 ENCOUNTER — Emergency Department (HOSPITAL_COMMUNITY): Payer: Commercial Managed Care - HMO

## 2022-06-25 DIAGNOSIS — J441 Chronic obstructive pulmonary disease with (acute) exacerbation: Principal | ICD-10-CM | POA: Diagnosis present

## 2022-06-25 DIAGNOSIS — Z720 Tobacco use: Secondary | ICD-10-CM | POA: Diagnosis present

## 2022-06-25 DIAGNOSIS — Z7951 Long term (current) use of inhaled steroids: Secondary | ICD-10-CM

## 2022-06-25 DIAGNOSIS — Z91148 Patient's other noncompliance with medication regimen for other reason: Secondary | ICD-10-CM

## 2022-06-25 DIAGNOSIS — T380X5A Adverse effect of glucocorticoids and synthetic analogues, initial encounter: Secondary | ICD-10-CM | POA: Diagnosis present

## 2022-06-25 DIAGNOSIS — J439 Emphysema, unspecified: Secondary | ICD-10-CM | POA: Diagnosis present

## 2022-06-25 DIAGNOSIS — Z79899 Other long term (current) drug therapy: Secondary | ICD-10-CM

## 2022-06-25 DIAGNOSIS — E876 Hypokalemia: Secondary | ICD-10-CM | POA: Diagnosis present

## 2022-06-25 DIAGNOSIS — Z1152 Encounter for screening for COVID-19: Secondary | ICD-10-CM

## 2022-06-25 DIAGNOSIS — R0602 Shortness of breath: Secondary | ICD-10-CM | POA: Diagnosis not present

## 2022-06-25 DIAGNOSIS — F17210 Nicotine dependence, cigarettes, uncomplicated: Secondary | ICD-10-CM | POA: Diagnosis present

## 2022-06-25 DIAGNOSIS — D72829 Elevated white blood cell count, unspecified: Secondary | ICD-10-CM | POA: Diagnosis present

## 2022-06-25 DIAGNOSIS — I10 Essential (primary) hypertension: Secondary | ICD-10-CM | POA: Diagnosis present

## 2022-06-25 LAB — BASIC METABOLIC PANEL
Anion gap: 14 (ref 5–15)
BUN: 7 mg/dL (ref 6–20)
CO2: 25 mmol/L (ref 22–32)
Calcium: 9 mg/dL (ref 8.9–10.3)
Chloride: 99 mmol/L (ref 98–111)
Creatinine, Ser: 0.71 mg/dL (ref 0.61–1.24)
GFR, Estimated: >60 mL/min (ref >60)
Glucose, Bld: 140 mg/dL — ABNORMAL HIGH (ref 70–99)
Potassium: 4.5 mmol/L (ref 3.5–5.1)
Sodium: 138 mmol/L (ref 135–145)

## 2022-06-25 LAB — I-STAT ARTERIAL BLOOD GAS, ED
Acid-base deficit: 3 mmol/L — ABNORMAL HIGH (ref 0.0–2.0)
Bicarbonate: 24 mmol/L (ref 20.0–28.0)
Calcium, Ion: 1.21 mmol/L (ref 1.15–1.40)
HCT: 51 % (ref 39.0–52.0)
Hemoglobin: 17.3 g/dL — ABNORMAL HIGH (ref 13.0–17.0)
O2 Saturation: 98 %
Potassium: 3.8 mmol/L (ref 3.5–5.1)
Sodium: 137 mmol/L (ref 135–145)
TCO2: 25 mmol/L (ref 22–32)
pCO2 arterial: 46.7 mmHg (ref 32–48)
pH, Arterial: 7.319 — ABNORMAL LOW (ref 7.35–7.45)
pO2, Arterial: 108 mmHg (ref 83–108)

## 2022-06-25 LAB — CBC WITH DIFFERENTIAL/PLATELET
Abs Immature Granulocytes: 0.06 10*3/uL (ref 0.00–0.07)
Basophils Absolute: 0.1 10*3/uL (ref 0.0–0.1)
Basophils Relative: 1 %
Eosinophils Absolute: 1 10*3/uL — ABNORMAL HIGH (ref 0.0–0.5)
Eosinophils Relative: 6 %
HCT: 52.5 % — ABNORMAL HIGH (ref 39.0–52.0)
Hemoglobin: 16.7 g/dL (ref 13.0–17.0)
Immature Granulocytes: 0 %
Lymphocytes Relative: 15 %
Lymphs Abs: 2.7 10*3/uL (ref 0.7–4.0)
MCH: 28.3 pg (ref 26.0–34.0)
MCHC: 31.8 g/dL (ref 30.0–36.0)
MCV: 88.8 fL (ref 80.0–100.0)
Monocytes Absolute: 1.1 10*3/uL — ABNORMAL HIGH (ref 0.1–1.0)
Monocytes Relative: 6 %
Neutro Abs: 13.3 10*3/uL — ABNORMAL HIGH (ref 1.7–7.7)
Neutrophils Relative %: 72 %
Platelets: 340 10*3/uL (ref 150–400)
RBC: 5.91 MIL/uL — ABNORMAL HIGH (ref 4.22–5.81)
RDW: 13.7 % (ref 11.5–15.5)
WBC: 18.2 10*3/uL — ABNORMAL HIGH (ref 4.0–10.5)
nRBC: 0 % (ref 0.0–0.2)

## 2022-06-25 LAB — TROPONIN I (HIGH SENSITIVITY)
Troponin I (High Sensitivity): 8 ng/L (ref <18)
Troponin I (High Sensitivity): 5 ng/L (ref <18)

## 2022-06-25 LAB — RESPIRATORY PANEL BY PCR

## 2022-06-25 LAB — RESP PANEL BY RT-PCR (FLU A&B, COVID) ARPGX2
Influenza A by PCR: NEGATIVE
Influenza B by PCR: NEGATIVE
SARS Coronavirus 2 by RT PCR: NEGATIVE

## 2022-06-25 MED ORDER — ALBUTEROL SULFATE (2.5 MG/3ML) 0.083% IN NEBU: 2.5000 mg | INHALATION_SOLUTION | RESPIRATORY_TRACT | Status: DC | PRN

## 2022-06-25 MED ORDER — LEVALBUTEROL HCL 1.25 MG/0.5ML IN NEBU
1.2500 mg | INHALATION_SOLUTION | Freq: Once | RESPIRATORY_TRACT | Status: AC
  Administered 2022-06-25: 1.25 mg via RESPIRATORY_TRACT
  Filled 2022-06-25: qty 0.5

## 2022-06-25 MED ORDER — ACETAMINOPHEN 325 MG PO TABS: 650.0000 mg | ORAL_TABLET | Freq: Four times a day (QID) | ORAL | Status: DC | PRN

## 2022-06-25 MED ORDER — FLUTICASONE FUROATE-VILANTEROL 100-25 MCG/ACT IN AEPB
1.0000 | INHALATION_SPRAY | Freq: Every day | RESPIRATORY_TRACT | Status: DC
  Administered 2022-06-25 – 2022-06-27 (×3): 1 via RESPIRATORY_TRACT
  Filled 2022-06-25: qty 28

## 2022-06-25 MED ORDER — ALBUTEROL (5 MG/ML) CONTINUOUS INHALATION SOLN
10.0000 mg/h | INHALATION_SOLUTION | Freq: Once | RESPIRATORY_TRACT | Status: AC
  Administered 2022-06-25: 10 mg/h via RESPIRATORY_TRACT
  Filled 2022-06-25: qty 20

## 2022-06-25 MED ORDER — ONDANSETRON HCL 4 MG PO TABS: 4.0000 mg | ORAL_TABLET | Freq: Four times a day (QID) | ORAL | Status: DC | PRN

## 2022-06-25 MED ORDER — ONDANSETRON HCL 4 MG/2ML IJ SOLN: 4.0000 mg | Freq: Four times a day (QID) | INTRAMUSCULAR | Status: DC | PRN

## 2022-06-25 MED ORDER — PREDNISONE 20 MG PO TABS
40.0000 mg | ORAL_TABLET | Freq: Every day | ORAL | Status: DC
  Administered 2022-06-26 – 2022-06-27 (×2): 40 mg via ORAL
  Filled 2022-06-25 (×2): qty 2

## 2022-06-25 MED ORDER — IPRATROPIUM BROMIDE 0.02 % IN SOLN
0.5000 mg | Freq: Four times a day (QID) | RESPIRATORY_TRACT | Status: AC
  Administered 2022-06-25 – 2022-06-26 (×3): 0.5 mg via RESPIRATORY_TRACT
  Filled 2022-06-25 (×3): qty 2.5

## 2022-06-25 MED ORDER — PREDNISONE 20 MG PO TABS: 60.0000 mg | ORAL_TABLET | Freq: Every day | ORAL | Status: DC

## 2022-06-25 MED ORDER — LACTATED RINGERS IV SOLN: INTRAVENOUS | Status: AC

## 2022-06-25 MED ORDER — ACETAMINOPHEN 650 MG RE SUPP: 650.0000 mg | Freq: Four times a day (QID) | RECTAL | Status: DC | PRN

## 2022-06-25 MED ORDER — UMECLIDINIUM-VILANTEROL 62.5-25 MCG/ACT IN AEPB
1.0000 | INHALATION_SPRAY | Freq: Every day | RESPIRATORY_TRACT | Status: DC
  Administered 2022-06-25 – 2022-06-26 (×2): 1 via RESPIRATORY_TRACT
  Filled 2022-06-25: qty 14

## 2022-06-25 MED ORDER — RIVAROXABAN 10 MG PO TABS
10.0000 mg | ORAL_TABLET | Freq: Every day | ORAL | Status: DC
  Administered 2022-06-25 – 2022-06-27 (×3): 10 mg via ORAL
  Filled 2022-06-25 (×3): qty 1

## 2022-06-25 NOTE — Hospital Course (Addendum)
Steven [name] is a 51 y/o person living with a history of HTN, COPD (no prior PFT's), severe bullous emphysema noted on imaging, and tobacco use disorder who presents with worsening shortness of breath for the last week that persisted the day prior to admission despite frequently using his albuterol inhaler prompting him to present to the ED.   #Acute COPD exacerbation #Severe bullous emphysema Patient presented with shortness of breath.  Patient was in acute COPD exacerbation.  Patient was treated with systemic steroids.  Patient initially required BiPAP, and eventually was transitioned to nasal cannula, and finally on room air.  Patient will be discharged on Incruse, advair, as well as prednisone taper for the next 2 weeks. Have reached out to pulmonology for outpatient follow-up.   #Hypertension Patient has a past medical history of hypertension.  Patient takes amlodipine 10 mg daily and losartan 25 mg daily.  Patient blood pressures were slightly elevated during hospitalization, and patient will be discharged on amlodipine 5 mg daily.  Consider restarting other home meds as necessary.  #Hypokalemia Patient potassium was a slightly low during hospitalization.  Patient's potassium was repleted.  #Leukocytosis Patient likely had leukocytosis with elevated white count of 16.1.  This was likely secondary to steroid use.   #Tobacco use disorder Patient encouraged to stop smoking during hospitalization.

## 2022-06-25 NOTE — Progress Notes (Signed)
Pt taken off BIPAP and is tolerating well. RT will monitor.

## 2022-06-25 NOTE — ED Notes (Signed)
Admitting provider at bedside.

## 2022-06-25 NOTE — ED Triage Notes (Signed)
Pt arrives via EMS from home with SOB for a week that worsened today. Seen here on 11/15-11/17 for COPD exac. Pt has follow-up with pulmonologist on Monday. EMS gave 2 g Mag, 125 mg solu-medrol, 5 mg albuterol, 0.5 mg atrovent. Pt was on home O2 for EMS, accessory muscle use.

## 2022-06-25 NOTE — ED Provider Notes (Signed)
[facility] EMERGENCY DEPARTMENT Provider Note   CSN: [id number] Arrival date & time: 06/25/22  1006     History  Chief Complaint  Patient presents with   Shortness of Breath    Steven [name] is a 51 y.o. male.  Patient is a 51 year old male who presents with wheezing and shortness of breath.  He has a known history of COPD.  He has had a recent admission from November 15 through November 16 for a COPD exacerbation.  He presents today with worsening symptoms.  He was noted to have accessory muscle use by EMS.  He was given magnesium 2 g, Solu-Medrol 125 mg and 5 mg of albuterol with Atrovent.  He was given an appointment to follow-up with pulmonology on his last visit and has an appointment on Monday.  He feels tight across his chest but no other chest pain.  No leg swelling.  No known fevers.  He recently completed a course of Zithromax and prednisone following his recent hospitalization.       Home Medications Prior to Admission medications   Medication Sig Start Date End Date Taking? Authorizing Provider  acetaminophen (TYLENOL) 500 MG tablet Take 2 tablets (1,000 mg total) by mouth every 6 (six) hours as needed for moderate pain or headache. 05/04/22   Rocky Morel, DO  albuterol (VENTOLIN HFA) 108 (90 Base) MCG/ACT inhaler Inhale 2 puffs into the lungs every 6 (six) hours as needed for wheezing or shortness of breath. 06/16/22   Champ Mungo, DO  amLODipine (NORVASC) 10 MG tablet Take 1 tablet (10 mg total) by mouth daily. Patient not taking: Reported on 06/15/2022 12/30/21 06/15/22  Champ Mungo, DO  azithromycin [facility]) 250 MG tablet Take two tablets by mouth on Friday, 11/17, for one day. 06/17/22   Champ Mungo, DO  buPROPion (WELLBUTRIN XL) 150 MG 24 hr tablet Take 1 tablet (150 mg total) by mouth daily. Patient not taking: Reported on 06/15/2022 12/30/21 06/15/22  Champ Mungo, DO  cetirizine (ZYRTEC) 10 MG tablet Take 1 tablet (10 mg total) by mouth  daily. 05/04/22   Rocky Morel, DO  fluticasone [facility]) 50 MCG/ACT nasal spray Place 1 spray into both nostrils daily. Patient not taking: Reported on 06/15/2022 05/04/22   Rocky Morel, DO  fluticasone-salmeterol (ADVAIR) 100-50 MCG/ACT AEPB Inhale 1 puff into the lungs 2 (two) times daily. 05/04/22   Rocky Morel, DO  ipratropium (ATROVENT) 0.02 % nebulizer solution USe 1 vial (0.5 mg total) by nebulization every 4 (four) hours as needed for wheezing or shortness of breath. 12/30/21   Champ Mungo, DO  ipratropium (ATROVENT) 0.02 % nebulizer solution Take 2.5 mLs (0.5 mg total) by nebulization 2 (two) times daily and every 4 hours as needed 05/04/22   Rocky Morel, DO  losartan (COZAAR) 25 MG tablet Take 1 tablet (25 mg total) by mouth daily. Patient not taking: Reported on 06/15/2022 05/26/22 06/25/22  Reymundo Poll, MD  predniSONE (DELTASONE) 20 MG tablet Take 2 tablets (40 mg total) by mouth daily with breakfast. 06/17/22   Champ Mungo, DO  umeclidinium bromide (INCRUSE ELLIPTA) 62.5 MCG/ACT AEPB Inhale 1 puff into the lungs daily. 06/16/22   Champ Mungo, DO  varenicline (CHANTIX) 0.5 MG tablet Take 1 tab once daily for days 1-3. Take 1 tab twice daily for days 4-7. Take 2 tabs twice daily every day starting day 8. Patient not taking: Reported on 06/15/2022 12/30/21   Champ Mungo, DO  varenicline (CHANTIX) 0.5 MG tablet Take 1 tablet (0.5  mg total) by mouth 2 (two) times daily. Patient not taking: Reported on 06/15/2022 05/04/22   Rocky Morel, DO      Allergies    Patient has no known allergies.    Review of Systems   Review of Systems  Constitutional:  Positive for fatigue. Negative for chills, diaphoresis and fever.  HENT:  Negative for congestion, rhinorrhea and sneezing.   Eyes: Negative.   Respiratory:  Positive for cough, chest tightness and shortness of breath.   Cardiovascular:  Negative for chest pain and leg swelling.  Gastrointestinal:  Negative for abdominal  pain, blood in stool, diarrhea, nausea and vomiting.  Genitourinary:  Negative for difficulty urinating, flank pain, frequency and hematuria.  Musculoskeletal:  Negative for arthralgias and back pain.  Skin:  Negative for rash.  Neurological:  Negative for dizziness, speech difficulty, weakness, numbness and headaches.    Physical Exam Updated Vital Signs BP 130/73   Pulse (!) 106   Temp 98.3 F (36.8 C) (Oral)   Resp 16   Ht 5\' 10"  (1.778 m)   Wt 72 kg   SpO2 100%   BMI 22.78 kg/m  Physical Exam Constitutional:      Appearance: He is well-developed.  HENT:     Head: Normocephalic and atraumatic.  Eyes:     Pupils: Pupils are equal, round, and reactive to light.  Cardiovascular:     Rate and Rhythm: Normal rate and regular rhythm.     Heart sounds: Normal heart sounds.  Pulmonary:     Effort: Tachypnea and accessory muscle usage present. No respiratory distress.     Breath sounds: Decreased breath sounds and wheezing present. No rales.  Chest:     Chest wall: No tenderness.  Abdominal:     General: Bowel sounds are normal.     Palpations: Abdomen is soft.     Tenderness: There is no abdominal tenderness. There is no guarding or rebound.  Musculoskeletal:        General: Normal range of motion.     Cervical back: Normal range of motion and neck supple.     Comments: No significant edema or calf tenderness  Lymphadenopathy:     Cervical: No cervical adenopathy.  Skin:    General: Skin is warm and dry.     Findings: No rash.  Neurological:     Mental Status: He is alert and oriented to person, place, and time.     ED Results / Procedures / Treatments   Labs (all labs ordered are listed, but only abnormal results are displayed) Labs Reviewed  BASIC METABOLIC PANEL - Abnormal; Notable for the following components:      Result Value   Glucose, Bld 140 (*)    All other components within normal limits  CBC WITH DIFFERENTIAL/PLATELET - Abnormal; Notable for the  following components:   WBC 18.2 (*)    RBC 5.91 (*)    HCT 52.5 (*)    Neutro Abs 13.3 (*)    Monocytes Absolute 1.1 (*)    Eosinophils Absolute 1.0 (*)    All other components within normal limits  I-STAT ARTERIAL BLOOD GAS, ED - Abnormal; Notable for the following components:   pH, Arterial 7.319 (*)    Acid-base deficit 3.0 (*)    Hemoglobin 17.3 (*)    All other components within normal limits  RESP PANEL BY RT-PCR (FLU A&B, COVID) ARPGX2  TROPONIN I (HIGH SENSITIVITY)  TROPONIN I (HIGH SENSITIVITY)    EKG None  Radiology DG Chest Port 1 View  Result Date: 06/25/2022 CLINICAL DATA:  Shortness of breath. EXAM: PORTABLE CHEST 1 VIEW COMPARISON:  06/15/2022 FINDINGS: Heart size and mediastinal contours are unremarkable. The lungs are hyperinflated. Severe changes of bullous emphysema with bilateral large upper lobe bulla measuring up to 15.4 cm. This is similar to the previous exam. No superimposed airspace consolidation, pleural effusion or interstitial edema. IMPRESSION: 1. Severe bullous emphysema. 2. No acute findings. Electronically Signed   By: Signa Kell M.D.   On: 06/25/2022 11:05    Procedures Procedures    Medications Ordered in ED Medications  albuterol (PROVENTIL,VENTOLIN) solution continuous neb (10 mg/hr Nebulization Given 06/25/22 1128)  levalbuterol (XOPENEX) nebulizer solution 1.25 mg (1.25 mg Nebulization Given by Other 06/25/22 1038)    ED Course/ Medical Decision Making/ A&P                           Medical Decision Making Amount and/or Complexity of Data Reviewed Labs: ordered. Radiology: ordered.  Risk Prescription drug management. Decision regarding hospitalization.   Patient is a 50 year old male who presents with wheezing and shortness of breath consistent with his prior COPD exacerbations.  He was very tachypneic and had increased work of breathing on initial exam.  He was started on BiPAP.  He was initially given Xopenex given his  tachycardia.  This was followed by a continuous albuterol nebulizer treatment.  He was given magnesium and Solu-Medrol per EMS.  He had improvement but is still maintained on BiPAP.  Chest x-ray was performed which is interpreted by me and confirmed by the radiologist to show no evidence of pneumonia.  His COVID/flu test is negative.  His labs are nonconcerning.  His troponins are negative.  No ischemic changes on EKG.  He does not have other symptoms that sound more concerning for PE.  I spoke with the internal medicine teaching service to admit the patient for further treatment.  Final Clinical Impression(s) / ED Diagnoses Final diagnoses:  COPD exacerbation [facility])    Rx / DC Orders ED Discharge Orders     None         Rolan Bucco, MD 06/25/22 1236

## 2022-06-25 NOTE — Progress Notes (Signed)
RT placed pt on BIPAP 10/5 on 40% and is tolerating well. RT will monitor.

## 2022-06-25 NOTE — H&P (Addendum)
Date: 06/25/2022               Steven [name] Name:  Steven [name] MRN: [id number]  DOB: [birth year] Age / Sex: 51 y.o., male   PCP: Belva Agee, MD         Medical Service: Internal Medicine Teaching Service         Attending Physician: Dr. Rolan Bucco, MD    First Contact: Dr. Modena Slater  Pager: [phone]  Second Contact: Dr. Gwenevere Abbot  Pager: [phone]       After Hours (After 5p/  First Contact Pager: [phone]  weekends / holidays): Second Contact Pager: [phone]   Chief Complaint: dyspnea   History of Present Illness:   Steven [name] is a 51 y/o person living with a history of HTN, COPD (no prior PFT's), severe bullous emphysema noted on imaging, and tobacco use disorder who presents with worsening shortness of breath for the last week that persisted the day prior to admission despite frequently using his albuterol inhaler prompting him to present to the ED.    Steven [name] had recent hospitalizations 12/2021 and 06/15/2022 for COPD exacerbation. During his last hospitalization it was noted that Steven [name] may not have been using his inhalers daily. He was discharged with incruse ellipta (umeclidinium) and advair along with prednisone 40mg  for two additional days for a total of four day course and azithromycin for a total of three days. Steven [name] states that as soon as he completed his prednisone course he began feeling dyspneic. He could still walk but over the course of the week he got progressively more dyspneic. He does note that he also had increased productive cough of clear sputum during this time. He used his rescue inhaler starting about 2 days prior to admission and notes frequent use during the day prior to admission with minimal relief of his symptoms. He denies any fevers, chills, or sick contacts.    Of note, Steven [name] does continue to smoke and has not been able to follow up with pulmonology at this time. He does state that he has been adherent with his long term inhalers.    In the ED patients abg was 7.310/46.7/108/24 98% though this was after bipap. He received continuous albuterol neb +levalbuterol.   Meds:  Current Meds  Medication Sig   acetaminophen (TYLENOL) 500 MG tablet Take 2 tablets (1,000 mg total) by mouth every 6 (six) hours as needed for moderate pain or headache.   cetirizine (ZYRTEC) 10 MG tablet Take 1 tablet (10 mg total) by mouth daily.   umeclidinium bromide (INCRUSE ELLIPTA) 62.5 MCG/ACT AEPB Inhale 1 puff into the lungs daily.  Losartan 25 mg daily Amlodipine 10 mg daily Advair   Allergies: Allergies as of 06/25/2022   (No Known Allergies)   Past Medical History:  Diagnosis Date   Asthma    COPD (chronic obstructive pulmonary disease) (HCC)    Hypertension     Family History:  History reviewed. No pertinent family history.   Social History:  Lives With: Lives with wife Occupation: Maintenance Support: Wife in area Level of Function: All ADLs/IADLs can be done PCP:Dr. Katsadorous, [facility] Substances: 1-2 drinks every month or so. Has history of 12 beers per day for many years, he is unclear for how long he did this. He continues to smoke cigarettes. 30 pack year history. States has smoked less since last hospitalization   Review of Systems: A complete ROS was negative except as per HPI.   Physical Exam: Blood  pressure 119/76, pulse (!) 116, temperature 98.3 F (36.8 C), temperature source Oral, resp. rate 20, height 5\' 10"  (1.778 m), weight 72 kg, SpO2 100 %.  Constitutional: Well-developed, well-nourished, and in no distress.  HENT:  Head: Normocephalic and atraumatic.   Cardiovascular: Normal rate, regular rhythm, intact distal pulses. No gallop and no friction rub.  No murmur heard. No lower extremity edema  Pulmonary: Non labored breathing on bipap, diffuse wheezing, decreased breath sounds, no rales  Abdominal: Soft. Normal bowel sounds. Non distended and non tender Musculoskeletal: Normal range of motion.         General: No tenderness or edema.  Neurological: Alert and oriented to person, place, and time. Non focal  Skin: Skin is warm and dry.    EKG: personally reviewed my interpretation is sinus tachycardia w/ no evidence of acute ischemia  CXR: personally reviewed my interpretation is severe emphysema, no consolidation   Assessment & Plan by Problem: Principal Problem:   COPD with acute exacerbation [facility]) Steven [name] is a 51 y/o person living with a history of HTN, COPD (no prior PFT's), severe bullous emphysema noted on imaging, and tobacco use disorder who presents with worsening shortness of breath and increased sputum production for the last week that persisted despite albuterol use c/f COPD exacerbation.   #Acute COPD exacerbation  #Severe bullous emphysema  Increased sputum production and dyspnea for the past week. Likely precipitated by continued smoking use v viral URTI given report of increased cough w/ clear sputum production.  On presentation to the ED, required Bipap due to his work of breathing on admission. He was given IV mag, albuterol and levalbuterol nebs in addition to solumedrol. His breathing improved but still required bipap. He continues to have diffuse wheezing on exam. He had no fevers or chills. His COVID and flu are negative.   Steven [name] had no evidence of PNA on CXR, low suspicion for PE or ACS.  -Resume prednisone at 60mg  qd, may require prolonged taper, will hopefully get pulmonology referral sorted early next week -Scheduled duo nebs -Bipap breaks with  to eat  -Hold on antibiotics given recent treatment with azithromycin   #HTN Normotensive on presentation. Hold home antihypertensives for now.    #TUD Wellbutrin is on the [facility]. Per pharmacy review Steven [name] is not taking this medication. Will need further discussion on strategies Steven [name] would like to employ to help with his tobacco use disorder.   Dispo: Admit Steven [name] to Observation with expected length of  stay less than 2 midnights.  Signed: , MD 06/25/2022, 1:06 PM  After 5pm on weekdays and 1pm on weekends: On Call pager: [phone]

## 2022-06-26 DIAGNOSIS — Z91148 Patient's other noncompliance with medication regimen for other reason: Secondary | ICD-10-CM | POA: Diagnosis not present

## 2022-06-26 DIAGNOSIS — F17210 Nicotine dependence, cigarettes, uncomplicated: Secondary | ICD-10-CM | POA: Diagnosis present

## 2022-06-26 DIAGNOSIS — Z79899 Other long term (current) drug therapy: Secondary | ICD-10-CM | POA: Diagnosis not present

## 2022-06-26 DIAGNOSIS — E876 Hypokalemia: Secondary | ICD-10-CM | POA: Diagnosis present

## 2022-06-26 DIAGNOSIS — D72829 Elevated white blood cell count, unspecified: Secondary | ICD-10-CM | POA: Diagnosis present

## 2022-06-26 DIAGNOSIS — J441 Chronic obstructive pulmonary disease with (acute) exacerbation: Secondary | ICD-10-CM | POA: Diagnosis present

## 2022-06-26 DIAGNOSIS — R0602 Shortness of breath: Secondary | ICD-10-CM | POA: Diagnosis present

## 2022-06-26 DIAGNOSIS — J439 Emphysema, unspecified: Secondary | ICD-10-CM

## 2022-06-26 DIAGNOSIS — T380X5A Adverse effect of glucocorticoids and synthetic analogues, initial encounter: Secondary | ICD-10-CM | POA: Diagnosis present

## 2022-06-26 DIAGNOSIS — Z7951 Long term (current) use of inhaled steroids: Secondary | ICD-10-CM | POA: Diagnosis not present

## 2022-06-26 DIAGNOSIS — Z1152 Encounter for screening for COVID-19: Secondary | ICD-10-CM | POA: Diagnosis not present

## 2022-06-26 DIAGNOSIS — I10 Essential (primary) hypertension: Secondary | ICD-10-CM | POA: Diagnosis present

## 2022-06-26 LAB — COMPREHENSIVE METABOLIC PANEL
ALT: 19 U/L (ref 0–44)
AST: 15 U/L (ref 15–41)
Albumin: 2.8 g/dL — ABNORMAL LOW (ref 3.5–5.0)
Alkaline Phosphatase: 57 U/L (ref 38–126)
Anion gap: 9 (ref 5–15)
BUN: 12 mg/dL (ref 6–20)
CO2: 23 mmol/L (ref 22–32)
Calcium: 8.4 mg/dL — ABNORMAL LOW (ref 8.9–10.3)
Chloride: 103 mmol/L (ref 98–111)
Creatinine, Ser: 0.88 mg/dL (ref 0.61–1.24)
GFR, Estimated: >60 mL/min (ref >60)
Glucose, Bld: 184 mg/dL — ABNORMAL HIGH (ref 70–99)
Potassium: 3.2 mmol/L — ABNORMAL LOW (ref 3.5–5.1)
Sodium: 135 mmol/L (ref 135–145)
Total Bilirubin: 0.3 mg/dL (ref 0.3–1.2)
Total Protein: 5.5 g/dL — ABNORMAL LOW (ref 6.5–8.1)

## 2022-06-26 LAB — CBC
HCT: 40.1 % (ref 39.0–52.0)
Hemoglobin: 13.5 g/dL (ref 13.0–17.0)
MCH: 28.4 pg (ref 26.0–34.0)
MCHC: 33.7 g/dL (ref 30.0–36.0)
MCV: 84.4 fL (ref 80.0–100.0)
Platelets: 274 10*3/uL (ref 150–400)
RBC: 4.75 MIL/uL (ref 4.22–5.81)
RDW: 13.4 % (ref 11.5–15.5)
WBC: 19.4 10*3/uL — ABNORMAL HIGH (ref 4.0–10.5)
nRBC: 0 % (ref 0.0–0.2)

## 2022-06-26 LAB — MAGNESIUM: Magnesium: 2.1 mg/dL (ref 1.7–2.4)

## 2022-06-26 MED ORDER — ALBUTEROL SULFATE (2.5 MG/3ML) 0.083% IN NEBU: 2.5000 mg | INHALATION_SOLUTION | RESPIRATORY_TRACT | Status: DC | PRN

## 2022-06-26 MED ORDER — UMECLIDINIUM BROMIDE 62.5 MCG/ACT IN AEPB
1.0000 | INHALATION_SPRAY | Freq: Every day | RESPIRATORY_TRACT | Status: DC
  Filled 2022-06-26: qty 7

## 2022-06-26 MED ORDER — POTASSIUM CHLORIDE CRYS ER 20 MEQ PO TBCR
40.0000 meq | EXTENDED_RELEASE_TABLET | Freq: Two times a day (BID) | ORAL | Status: AC
  Administered 2022-06-26 (×2): 40 meq via ORAL
  Filled 2022-06-26 (×2): qty 2

## 2022-06-26 MED ORDER — IPRATROPIUM-ALBUTEROL 0.5-2.5 (3) MG/3ML IN SOLN
3.0000 mL | Freq: Three times a day (TID) | RESPIRATORY_TRACT | Status: DC
  Administered 2022-06-27: 3 mL via RESPIRATORY_TRACT
  Filled 2022-06-26 (×2): qty 3

## 2022-06-26 MED ORDER — IPRATROPIUM-ALBUTEROL 0.5-2.5 (3) MG/3ML IN SOLN
3.0000 mL | Freq: Four times a day (QID) | RESPIRATORY_TRACT | Status: DC
  Administered 2022-06-26: 3 mL via RESPIRATORY_TRACT
  Filled 2022-06-26: qty 3

## 2022-06-26 NOTE — Plan of Care (Signed)
°  Problem: Education: °Goal: Knowledge of disease or condition will improve °Outcome: Progressing °Goal: Knowledge of the prescribed therapeutic regimen will improve °Outcome: Progressing °Goal: Individualized Educational Video(s) °Outcome: Progressing °  °

## 2022-06-26 NOTE — ED Notes (Signed)
ED TO INPATIENT HANDOFF REPORT  ED Nurse Name and Phone #: Rhenda [place]   S Name/Age/Gender Steven [name] 51 y.o. male Room/Bed: 035C/035C  Code Status   Code Status: Full Code  Home/SNF/Other Home Patient oriented to: self, place, time, and situation Is this baseline? Yes   Triage Complete: Triage complete  Chief Complaint COPD with acute exacerbation [facility]) [J44.1]  Triage Note Pt arrives via EMS from home with SOB for a week that worsened today. Seen here on 11/15-11/17 for COPD exac. Pt has follow-up with pulmonologist on Monday. EMS gave 2 g Mag, 125 mg solu-medrol, 5 mg albuterol, 0.5 mg atrovent. Pt was on home O2 for EMS, accessory muscle use.    Allergies No Known Allergies  Level of Care/Admitting Diagnosis ED Disposition     ED Disposition  Admit   Condition  --   Comment  Hospital Area: [facility] [[id number]]  Level of Care: Progressive [102]  Admit to Progressive based on following criteria: RESPIRATORY PROBLEMS hypoxemic/hypercapnic respiratory failure that is responsive to NIPPV (BiPAP) or High Flow Nasal Cannula (6-80 lpm). Frequent assessment/intervention, no > Q2 hrs < Q4 hrs, to maintain oxygenation and pulmonary hygiene.  May admit patient to [facility] or [facility] if equivalent level of care is available:: Yes  Covid Evaluation: Confirmed COVID Negative  Diagnosis: COPD with acute exacerbation [facility]) [[id number]]  Admitting Physician: Tyson Alias [[id number]]  Attending Physician: Tyson Alias [phone]  Certification:: I certify this patient will need inpatient services for at least 2 midnights  Estimated Length of Stay: 2          B Medical/Surgery History Past Medical History:  Diagnosis Date   Asthma    COPD (chronic obstructive pulmonary disease) (HCC)    Hypertension    History reviewed. No pertinent surgical history.   A IV Location/Drains/Wounds Patient Lines/Drains/Airways Status     Active  Line/Drains/Airways     Name Placement date Placement time Site Days   Peripheral IV 06/25/22 18 G Left Antecubital 06/25/22  1402  Antecubital  1            Intake/Output Last 24 hours  Intake/Output Summary (Last 24 hours) at 06/26/2022 1326 Last data filed at 06/26/2022 0159 Gross per 24 hour  Intake 1160.47 ml  Output 400 ml  Net 760.47 ml    Labs/Imaging Results for orders placed or performed during the hospital encounter of 06/25/22 (from the past 48 hour(s))  Basic metabolic panel     Status: Abnormal   Collection Time: 06/25/22 10:17 AM  Result Value Ref Range   Sodium 138 135 - 145 mmol/L   Potassium 4.5 3.5 - 5.1 mmol/L    Comment: HEMOLYSIS AT THIS LEVEL MAY AFFECT RESULT   Chloride 99 98 - 111 mmol/L   CO2 25 22 - 32 mmol/L   Glucose, Bld 140 (H) 70 - 99 mg/dL    Comment: Glucose reference range applies only to samples taken after fasting for at least 8 hours.   BUN 7 6 - 20 mg/dL   Creatinine, Ser 8.93 0.61 - 1.24 mg/dL   Calcium 9.0 8.9 - 81.0 mg/dL   GFR, Estimated >17 >51 mL/min    Comment: (NOTE) Calculated using the CKD-EPI Creatinine Equation (2021)    Anion gap 14 5 - 15    Comment: Performed at [facility], [address], [place], [place] [address]  CBC with Differential     Status: Abnormal   Collection Time:  06/25/22 10:17 AM  Result Value Ref Range   WBC 18.2 (H) 4.0 - 10.5 K/uL   RBC 5.91 (H) 4.22 - 5.81 MIL/uL   Hemoglobin 16.7 13.0 - 17.0 g/dL   HCT 16.152.5 (H) 09.639.0 - 04.552.0 %   MCV 88.8 80.0 - 100.0 fL   MCH 28.3 26.0 - 34.0 pg   MCHC 31.8 30.0 - 36.0 g/dL   RDW 40.913.7 81.111.5 - 91.415.5 %   Platelets 340 150 - 400 K/uL   nRBC 0.0 0.0 - 0.2 %   Neutrophils Relative % 72 %   Neutro Abs 13.3 (H) 1.7 - 7.7 K/uL   Lymphocytes Relative 15 %   Lymphs Abs 2.7 0.7 - 4.0 K/uL   Monocytes Relative 6 %   Monocytes Absolute 1.1 (H) 0.1 - 1.0 K/uL   Eosinophils Relative 6 %   Eosinophils Absolute 1.0 (H) 0.0 - 0.5 K/uL   Basophils Relative 1 %    Basophils Absolute 0.1 0.0 - 0.1 K/uL   Immature Granulocytes 0 %   Abs Immature Granulocytes 0.06 0.00 - 0.07 K/uL    Comment: Performed at [facility], [address] [place], [place] [address]  Troponin I (High Sensitivity)     Status: None   Collection Time: 06/25/22 10:17 AM  Result Value Ref Range   Troponin I (High Sensitivity) 8 <18 ng/L    Comment: (NOTE) Elevated high sensitivity troponin I (hsTnI) values and significant  changes across serial measurements may suggest ACS but many other  chronic and acute conditions are known to elevate hsTnI results.  Refer to the "Links" section for chest pain algorithms and additional  guidance. Performed at [facility], [address] [place], [place] [address]   Resp Panel by RT-PCR (Flu A&B, Covid) Anterior Nasal Swab     Status: None   Collection Time: 06/25/22 10:17 AM   Specimen: Anterior Nasal Swab  Result Value Ref Range   SARS Coronavirus 2 by RT PCR NEGATIVE NEGATIVE    Comment: (NOTE) SARS-CoV-2 target nucleic acids are NOT DETECTED.  The SARS-CoV-2 RNA is generally detectable in upper respiratory specimens during the acute phase of infection. The lowest concentration of SARS-CoV-2 viral copies this assay can detect is 138 copies/mL. A negative result does not preclude SARS-Cov-2 infection and should not be used as the sole basis for treatment or other patient management decisions. A negative result may occur with  improper specimen collection/handling, submission of specimen other than nasopharyngeal swab, presence of viral mutation(s) within the areas targeted by this assay, and inadequate number of viral copies(<138 copies/mL). A negative result must be combined with clinical observations, patient history, and epidemiological information. The expected result is Negative.  Fact Sheet for Patients:  BloggerCourse.comhttps://www.fda.gov/media/152166/download  Fact Sheet for Healthcare Providers:   SeriousBroker.ithttps://www.fda.gov/media/152162/download  This test is no t yet approved or cleared by the [place] FDA and  has been authorized for detection and/or diagnosis of SARS-CoV-2 by FDA under an Emergency Use Authorization (EUA). This EUA will remain  in effect (meaning this test can be used) for the duration of the COVID-19 declaration under Section 564(b)(1) of the Act, 21 U.S.C.section 360bbb-3(b)(1), unless the authorization is terminated  or revoked sooner.       Influenza A by PCR NEGATIVE NEGATIVE   Influenza B by PCR NEGATIVE NEGATIVE    Comment: (NOTE) The Xpert Xpress SARS-CoV-2/FLU/RSV plus assay is intended as an aid in the diagnosis of influenza from Nasopharyngeal swab specimens and  should not be used as a sole basis for treatment. Nasal washings and aspirates are unacceptable for Xpert Xpress SARS-CoV-2/FLU/RSV testing.  Fact Sheet for Patients: BloggerCourse.com  Fact Sheet for Healthcare Providers: SeriousBroker.it  This test is not yet approved or cleared by the [place] FDA and has been authorized for detection and/or diagnosis of SARS-CoV-2 by FDA under an Emergency Use Authorization (EUA). This EUA will remain in effect (meaning this test can be used) for the duration of the COVID-19 declaration under Section 564(b)(1) of the Act, 21 U.S.C. section 360bbb-3(b)(1), unless the authorization is terminated or revoked.  Performed at [facility], [address], [place], [place] [address]   Respiratory (~20 pathogens) panel by PCR     Status: None   Collection Time: 06/25/22 10:17 AM   Specimen: Nasopharyngeal Swab; Respiratory  Result Value Ref Range   Adenovirus NOT DETECTED NOT DETECTED   Coronavirus 229E NOT DETECTED NOT DETECTED    Comment: (NOTE) The Coronavirus on the Respiratory Panel, DOES NOT test for the novel  Coronavirus (2019 nCoV)    Coronavirus HKU1 NOT DETECTED NOT DETECTED    Coronavirus NL63 NOT DETECTED NOT DETECTED   Coronavirus OC43 NOT DETECTED NOT DETECTED   Metapneumovirus NOT DETECTED NOT DETECTED   Rhinovirus / Enterovirus NOT DETECTED NOT DETECTED   Influenza A NOT DETECTED NOT DETECTED   Influenza B NOT DETECTED NOT DETECTED   Parainfluenza Virus 1 NOT DETECTED NOT DETECTED   Parainfluenza Virus 2 NOT DETECTED NOT DETECTED   Parainfluenza Virus 3 NOT DETECTED NOT DETECTED   Parainfluenza Virus 4 NOT DETECTED NOT DETECTED   Respiratory Syncytial Virus NOT DETECTED NOT DETECTED   Bordetella pertussis NOT DETECTED NOT DETECTED   Bordetella Parapertussis NOT DETECTED NOT DETECTED   Chlamydophila pneumoniae NOT DETECTED NOT DETECTED   Mycoplasma pneumoniae NOT DETECTED NOT DETECTED    Comment: Performed at [facility], [address] [place], [place] [address]  I-Stat arterial blood gas, ED [facility] ED only)     Status: Abnormal   Collection Time: 06/25/22 11:41 AM  Result Value Ref Range   pH, Arterial 7.319 (L) 7.35 - 7.45   pCO2 arterial 46.7 32 - 48 mmHg   pO2, Arterial 108 83 - 108 mmHg   Bicarbonate 24.0 20.0 - 28.0 mmol/L   TCO2 25 22 - 32 mmol/L   O2 Saturation 98 %   Acid-base deficit 3.0 (H) 0.0 - 2.0 mmol/L   Sodium 137 135 - 145 mmol/L   Potassium 3.8 3.5 - 5.1 mmol/L   Calcium, Ion 1.21 1.15 - 1.40 mmol/L   HCT 51.0 39.0 - 52.0 %   Hemoglobin 17.3 (H) 13.0 - 17.0 g/dL   Sample type ARTERIAL   Troponin I (High Sensitivity)     Status: None   Collection Time: 06/25/22 12:17 PM  Result Value Ref Range   Troponin I (High Sensitivity) 5 <18 ng/L    Comment: (NOTE) Elevated high sensitivity troponin I (hsTnI) values and significant  changes across serial measurements may suggest ACS but many other  chronic and acute conditions are known to elevate hsTnI results.  Refer to the "Links" section for chest pain algorithms and additional  guidance. Performed at [facility], [address], [place], [place] [address]    Comprehensive metabolic panel     Status: Abnormal   Collection Time: 06/26/22  3:28 AM  Result Value Ref Range   Sodium 135 135 - 145 mmol/L  Potassium 3.2 (L) 3.5 - 5.1 mmol/L   Chloride 103 98 - 111 mmol/L   CO2 23 22 - 32 mmol/L   Glucose, Bld 184 (H) 70 - 99 mg/dL    Comment: Glucose reference range applies only to samples taken after fasting for at least 8 hours.   BUN 12 6 - 20 mg/dL   Creatinine, Ser 1.61 0.61 - 1.24 mg/dL   Calcium 8.4 (L) 8.9 - 10.3 mg/dL   Total Protein 5.5 (L) 6.5 - 8.1 g/dL   Albumin 2.8 (L) 3.5 - 5.0 g/dL   AST 15 15 - 41 U/L   ALT 19 0 - 44 U/L   Alkaline Phosphatase 57 38 - 126 U/L   Total Bilirubin 0.3 0.3 - 1.2 mg/dL   GFR, Estimated >09 >60 mL/min    Comment: (NOTE) Calculated using the CKD-EPI Creatinine Equation (2021)    Anion gap 9 5 - 15    Comment: Performed at [facility], [address] [place], [place] [address]  CBC     Status: Abnormal   Collection Time: 06/26/22  3:28 AM  Result Value Ref Range   WBC 19.4 (H) 4.0 - 10.5 K/uL   RBC 4.75 4.22 - 5.81 MIL/uL   Hemoglobin 13.5 13.0 - 17.0 g/dL   HCT 81.1 91.4 - 78.2 %   MCV 84.4 80.0 - 100.0 fL   MCH 28.4 26.0 - 34.0 pg   MCHC 33.7 30.0 - 36.0 g/dL   RDW 95.6 21.3 - 08.6 %   Platelets 274 150 - 400 K/uL   nRBC 0.0 0.0 - 0.2 %    Comment: Performed at [facility], [address], [place], [place] [address]   DG Chest Port 1 View  Result Date: 06/25/2022 CLINICAL DATA:  Shortness of breath. EXAM: PORTABLE CHEST 1 VIEW COMPARISON:  06/15/2022 FINDINGS: Heart size and mediastinal contours are unremarkable. The lungs are hyperinflated. Severe changes of bullous emphysema with bilateral large upper lobe bulla measuring up to 15.4 cm. This is similar to the previous exam. No superimposed airspace consolidation, pleural effusion or interstitial edema. IMPRESSION: 1. Severe bullous emphysema. 2. No acute findings. Electronically Signed   By: Signa Kell M.D.   On:  06/25/2022 11:05    Pending Labs Unresulted Labs (From admission, onward)     Start     Ordered   06/27/22 0500  Basic metabolic panel  Tomorrow morning,   R        06/26/22 1057   06/27/22 0500  CBC with Differential/Platelet  Tomorrow morning,   R        06/26/22 1057   06/26/22 0659  Magnesium  Add-on,   AD        06/26/22 0658            Vitals/Pain Today's Vitals   06/26/22 0730 06/26/22 0930 06/26/22 1025 06/26/22 1145  BP:  131/78 119/85 125/83  Pulse:   96   Resp:  (!) 25 (!) 22 (!) 28  Temp:   98 F (36.7 C)   TempSrc:      SpO2:   99%   Weight:      Height:      PainSc: 0-No pain       Isolation Precautions No active isolations  Medications Medications  fluticasone furoate-vilanterol (BREO ELLIPTA) 100-25 MCG/ACT 1 [name] (1 [name] Inhalation Given 06/26/22 1027)  rivaroxaban (XARELTO) tablet 10 mg (10 mg Oral Given 06/26/22 1034)  acetaminophen (TYLENOL)  tablet 650 mg (has no administration in time range)    Or  acetaminophen (TYLENOL) suppository 650 mg (has no administration in time range)  ondansetron (ZOFRAN) tablet 4 mg (has no administration in time range)    Or  ondansetron (ZOFRAN) injection 4 mg (has no administration in time range)  ipratropium (ATROVENT) nebulizer solution 0.5 mg (0.5 mg Nebulization Given 06/26/22 0751)  lactated ringers infusion (0 mLs Intravenous Stopped 06/26/22 0159)  predniSONE (DELTASONE) tablet 40 mg (40 mg Oral Given 06/26/22 0750)  potassium chloride SA (KLOR-CON M) CR tablet 40 mEq (40 mEq Oral Not Given 06/26/22 1135)  umeclidinium bromide (INCRUSE ELLIPTA) 62.5 MCG/ACT 1 [name] (has no administration in time range)  ipratropium-albuterol (DUONEB) 0.5-2.5 (3) MG/3ML nebulizer solution 3 mL (has no administration in time range)  albuterol (PROVENTIL,VENTOLIN) solution continuous neb (10 mg/hr Nebulization Given 06/25/22 1128)  levalbuterol (XOPENEX) nebulizer solution 1.25 mg (1.25 mg Nebulization Given by Other  06/25/22 1038)    Mobility walks Low fall risk   Focused Assessments Pulmonary Assessment Handoff:  Lung sounds: Bilateral Breath Sounds: Diminished, Expiratory wheezes O2 Device: Room Air O2 Flow Rate (L/min): 4 L/min    R Recommendations: See Admitting Provider Note  Report given to:   Additional Notes:

## 2022-06-26 NOTE — Progress Notes (Signed)
HD#0 Subjective:   Summary: Steven [name] is a 51 y.o. with a pertinent PMH hypertension, COPD, emphysema, tobacco use disorder who presents with concerns of worsening shortness of breath and increased sputum production.  Patient admitted for further treatment and management of COPD exacerbation.  Overnight Events: No overnight events   Patient states he is doing well.  He denies any shortness of breath or chest pain.  He states he slept fine.  He has no concerns at this time.  Objective:  Vital signs in last 24 hours: Vitals:   06/26/22 0645 06/26/22 0930 06/26/22 1025 06/26/22 1145  BP: 125/84 131/78 119/85 125/83  Pulse: 80  96   Resp: (!) 22 (!) 25 (!) 22 (!) 28  Temp:   98 F (36.7 C)   TempSrc:      SpO2: 100%  99%   Weight:      Height:       Supplemental O2: Room Air SpO2: 100 %   Physical Exam:  Constitutional: Patient resting in bed upon my exam, no acute distress HENT: normocephalic atraumatic, mucous membranes moist Eyes: conjunctiva non-erythematous Neck: supple Cardiovascular: regular rate and rhythm, no m/r/g Pulmonary/Chest: normal work of breathing on room air, with inspiratory and expiratory wheezing noted to bilateral lung field  Abdominal: soft, non-tender, non-distended MSK: normal bulk and tone  Filed Weights   06/25/22 1010  Weight: 72 kg     Intake/Output Summary (Last 24 hours) at 06/26/2022 1259 Last data filed at 06/26/2022 0159 Gross per 24 hour  Intake 1160.47 ml  Output 400 ml  Net 760.47 ml   Net IO Since Admission: 760.47 mL [06/26/22 1259]  Pertinent Labs:    Latest Ref Rng & Units 06/26/2022    3:28 AM 06/25/2022   11:41 AM 06/25/2022   10:17 AM  CBC  WBC 4.0 - 10.5 K/uL 19.4   18.2   Hemoglobin 13.0 - 17.0 g/dL 78.5  88.5  02.7   Hematocrit 39.0 - 52.0 % 40.1  51.0  52.5   Platelets 150 - 400 K/uL 274   340        Latest Ref Rng & Units 06/26/2022    3:28 AM 06/25/2022   11:41 AM 06/25/2022   10:17 AM  CMP   Glucose 70 - 99 mg/dL 741   287   BUN 6 - 20 mg/dL 12   7   Creatinine 8.67 - 1.24 mg/dL 6.72   0.94   Sodium 709 - 145 mmol/L 135  137  138   Potassium 3.5 - 5.1 mmol/L 3.2  3.8  4.5   Chloride 98 - 111 mmol/L 103   99   CO2 22 - 32 mmol/L 23   25   Calcium 8.9 - 10.3 mg/dL 8.4   9.0   Total Protein 6.5 - 8.1 g/dL 5.5     Total Bilirubin 0.3 - 1.2 mg/dL 0.3     Alkaline Phos 38 - 126 U/L 57     AST 15 - 41 U/L 15     ALT 0 - 44 U/L 19       Imaging: No results found.  Assessment/Plan:   Principal Problem:   COPD with acute exacerbation (HCC) Active Problems:   Chronic bullous emphysema (HCC)   Tobacco use   Patient Summary: Steven [name] is a 51 y.o. with a pertinent PMH hypertension, COPD, emphysema, tobacco use disorder who presents with concerns of worsening shortness of breath and increased sputum production.  Patient admitted for further treatment and management of COPD exacerbation.  #Acute COPD exacerbation #Severe bullous emphysema Patient was recently hospitalized for COPD exacerbation, patient was discharged on azithromycin as well as prednisone for 2 days.  Patient states that since his discharge, his shortness of breath did not really go away.  He felt like the prednisone taper was not long enough for him.  He states that he was hospitalized back in [facility] where they gave him a week or 2 of a steroid taper after being admitted for a COPD exacerbation.  Patient initially required BiPAP, but slowly transitioned off to nasal cannula after initiation of systematic storage.  Patient currently on room air satting at 100%.  Patient has no shortness of breath.  Patient likely needed a longer steroid taper.  No concern for pneumonia at this point.  No concern for any other infection at this point. -Continue DuoNebs every 4 hours for wheezing -Continue Breo Ellipta -Add Incruse Ellipta -Continue prednisone 40 mg daily, likely will need a longer taper -Patient  will likely need outpatient follow-up with pulmonology -Continue to monitor respiratory status -If patient does require oxygen, oxygenation goal 88-92%  #Hypertension Patient has home meds for treatment of hypertension in which patient takes amlodipine 10 mg daily, and losartan 25 mg daily.  Patient's blood pressure currently in the 120s and 130s.  This is not going above the 140s. -Hold home medications at this time -Continue to monitor blood pressure  #Hypokalemia Patient's potassium today at 3.2.  Will replete and recheck -Recheck BMP in a.m.  #Tobacco use disorder Patient encouraged to stop smoking.  Patient does have Chantix at home. -Patient encouraged to stop smoking -Nicotine patch offered  Diet: Normal IVF: None,None VTE: Xarelto Code: Full  Dispo: Anticipated discharge to Home in 1 days pending clinical improvement.   Modena Slater DO Internal Medicine Resident PGY-1 [phone] Please contact the on call pager after 5 pm and on weekends at [phone].

## 2022-06-26 NOTE — ED Notes (Signed)
Messaged MD concerning patients home blood pressure medication.

## 2022-06-27 ENCOUNTER — Encounter: Payer: Commercial Managed Care - HMO | Admitting: Student

## 2022-06-27 ENCOUNTER — Other Ambulatory Visit (HOSPITAL_COMMUNITY): Payer: Self-pay

## 2022-06-27 ENCOUNTER — Telehealth: Payer: Self-pay | Admitting: Pulmonary Disease

## 2022-06-27 DIAGNOSIS — J441 Chronic obstructive pulmonary disease with (acute) exacerbation: Secondary | ICD-10-CM | POA: Diagnosis not present

## 2022-06-27 LAB — BASIC METABOLIC PANEL
Anion gap: 8 (ref 5–15)
BUN: 6 mg/dL (ref 6–20)
CO2: 24 mmol/L (ref 22–32)
Calcium: 8.6 mg/dL — ABNORMAL LOW (ref 8.9–10.3)
Chloride: 102 mmol/L (ref 98–111)
Creatinine, Ser: 0.78 mg/dL (ref 0.61–1.24)
GFR, Estimated: >60 mL/min (ref >60)
Glucose, Bld: 85 mg/dL (ref 70–99)
Potassium: 4.1 mmol/L (ref 3.5–5.1)
Sodium: 134 mmol/L — ABNORMAL LOW (ref 135–145)

## 2022-06-27 LAB — CBC WITH DIFFERENTIAL/PLATELET
Abs Immature Granulocytes: 0.09 10*3/uL — ABNORMAL HIGH (ref 0.00–0.07)
Basophils Absolute: 0 10*3/uL (ref 0.0–0.1)
Basophils Relative: 0 %
Eosinophils Absolute: 0.1 10*3/uL (ref 0.0–0.5)
Eosinophils Relative: 1 %
HCT: 40.9 % (ref 39.0–52.0)
Hemoglobin: 14 g/dL (ref 13.0–17.0)
Immature Granulocytes: 1 %
Lymphocytes Relative: 11 %
Lymphs Abs: 1.7 10*3/uL (ref 0.7–4.0)
MCH: 28.6 pg (ref 26.0–34.0)
MCHC: 34.2 g/dL (ref 30.0–36.0)
MCV: 83.5 fL (ref 80.0–100.0)
Monocytes Absolute: 1 10*3/uL (ref 0.1–1.0)
Monocytes Relative: 6 %
Neutro Abs: 13.2 10*3/uL — ABNORMAL HIGH (ref 1.7–7.7)
Neutrophils Relative %: 81 %
Platelets: 268 10*3/uL (ref 150–400)
RBC: 4.9 MIL/uL (ref 4.22–5.81)
RDW: 13.5 % (ref 11.5–15.5)
WBC: 16.1 10*3/uL — ABNORMAL HIGH (ref 4.0–10.5)
nRBC: 0 % (ref 0.0–0.2)

## 2022-06-27 MED ORDER — AMLODIPINE BESYLATE 5 MG PO TABS
5.0000 mg | ORAL_TABLET | Freq: Every day | ORAL | 0 refills | Status: DC
  Filled 2022-06-27: qty 30, 30d supply, fill #0

## 2022-06-27 MED ORDER — IPRATROPIUM-ALBUTEROL 0.5-2.5 (3) MG/3ML IN SOLN: 3.0000 mL | Freq: Two times a day (BID) | RESPIRATORY_TRACT | Status: DC

## 2022-06-27 MED ORDER — UMECLIDINIUM BROMIDE 62.5 MCG/ACT IN AEPB
1.0000 | INHALATION_SPRAY | Freq: Every day | RESPIRATORY_TRACT | 0 refills | Status: DC
  Filled 2022-06-27 (×2): qty 30, 30d supply, fill #0

## 2022-06-27 MED ORDER — FLUTICASONE-SALMETEROL 100-50 MCG/ACT IN AEPB
1.0000 | INHALATION_SPRAY | Freq: Two times a day (BID) | RESPIRATORY_TRACT | 0 refills | Status: DC
  Filled 2022-06-27: qty 60, 30d supply, fill #0

## 2022-06-27 MED ORDER — ALBUTEROL SULFATE (2.5 MG/3ML) 0.083% IN NEBU: 2.5000 mg | INHALATION_SOLUTION | RESPIRATORY_TRACT | Status: DC | PRN

## 2022-06-27 MED ORDER — FLUTICASONE FUROATE-VILANTEROL 200-25 MCG/ACT IN AEPB
1.0000 | INHALATION_SPRAY | Freq: Every day | RESPIRATORY_TRACT | 0 refills | Status: DC
  Filled 2022-06-27: qty 60, 30d supply, fill #0

## 2022-06-27 MED ORDER — UMECLIDINIUM BROMIDE 62.5 MCG/ACT IN AEPB
1.0000 | INHALATION_SPRAY | Freq: Every day | RESPIRATORY_TRACT | 0 refills | Status: AC
  Filled 2022-06-27: qty 30, 30d supply, fill #0

## 2022-06-27 MED ORDER — PREDNISONE 10 MG PO TABS
ORAL_TABLET | ORAL | 0 refills | Status: AC
  Filled 2022-06-27: qty 32, 15d supply, fill #0

## 2022-06-27 NOTE — Progress Notes (Signed)
Patient stated he wore bipap in the ED but does not want to wear at this time

## 2022-06-27 NOTE — Discharge Summary (Signed)
Name: Steven Steven [name] MRN: [id number] DOB: 1970-10-13 51 y.o. PCP: Belva Agee, MD  Date of Admission: 06/25/2022 10:06 AM Date of Discharge: 06/27/2022 Attending Physician: Dr. Vernia Buff   Discharge Diagnosis: Principal Problem:   COPD with acute exacerbation [facility]) Active Problems:   Chronic bullous emphysema (HCC)   Tobacco use    Discharge Medications: Allergies as of 06/27/2022   No Known Allergies      Medication List     STOP taking these medications    azithromycin 250 MG tablet Commonly known as: ZITHROMAX   ipratropium 0.02 % nebulizer solution Commonly known as: ATROVENT   losartan 25 MG tablet Commonly known as: COZAAR       TAKE these medications    acetaminophen 500 MG tablet Commonly known as: TYLENOL Take 2 tablets (1,000 mg total) by mouth every 6 (six) hours as needed for moderate pain or headache.   albuterol 108 (90 Base) MCG/ACT inhaler Commonly known as: VENTOLIN HFA Inhale 2 puffs into the lungs every 6 (six) hours as needed for wheezing or shortness of breath.   amLODipine 5 MG tablet Commonly known as: NORVASC Take 1 tablet (5 mg total) by mouth daily. What changed:  medication strength how much to take   buPROPion 150 MG 24 hr tablet Commonly known as: WELLBUTRIN XL Take 1 tablet (150 mg total) by mouth daily.   cetirizine 10 MG tablet Commonly known as: ZYRTEC Take 1 tablet (10 mg total) by mouth daily.   fluticasone 50 MCG/ACT nasal spray Commonly known as: FLONASE Place 1 spray into both nostrils daily.   fluticasone-salmeterol 100-50 MCG/ACT Aepb Commonly known as: ADVAIR Inhale 1 Steven [name] into the lungs 2 (two) times daily.   predniSONE 10 MG tablet Commonly known as: DELTASONE Take 4 tablets (40 mg total) by mouth daily with breakfast for 3 days, THEN 3 tablets (30 mg total) daily with breakfast for 3 days, THEN 2 tablets (20 mg total) daily with breakfast for 3 days, THEN 1 tablet (10 mg total)  daily with breakfast for 3 days, THEN 0.5 tablets (5 mg total) daily with breakfast for 3 days. Start taking on: June 28, 2022 What changed:  medication strength See the new instructions.   umeclidinium bromide 62.5 MCG/ACT Aepb Commonly known as: INCRUSE ELLIPTA Inhale 1 Steven [name] into the lungs daily.   varenicline 0.5 MG tablet Commonly known as: CHANTIX Take 1 tab once daily for days 1-3. Take 1 tab twice daily for days 4-7. Take 2 tabs twice daily every day starting day 8. What changed: Another medication with the same name was removed. Continue taking this medication, and follow the directions you see here.        Disposition and follow-up:   StevenSteven Steven [name] was discharged from [facility] in Good condition.  At the hospital follow up visit please address:  1.  Follow-up:  a.  Acute COPD exacerbation: Patient discharged on prednisone taper.  Ensure patient respiratory status remained stable.  Patient also discharged on Advair as well as Incruse.  Ensure patient is compliant with his inhalers.  Ensure patient has proper pulmonology follow-up.    b.  Hypertension: Patient's blood pressures were within normal limits during hospitalization.  Patient's home medications included amlodipine 10 mg daily and losartan 25 mg daily.  We do suspect noncompliance.  Patient will be started on 5 mg amlodipine to be discharged with.  Consider titrating up as necessary outpatient.   C.  Leukocytosis: Get repeat CBC to  ensure leukocytosis has resolved.  Leukocytosis likely secondary to steroid use.  2.  Labs / imaging needed at time of follow-up: CBC  3.  Pending labs/ test needing follow-up: N/A  4.  Medication Changes  1) change amlodipine to 5 mg daily, hold losartan 25 mg daily  2) prednisone taper initiated for the next 16 days  Follow-up Appointments:  Follow-up Information     Champ MungoDean, Emily, DO. Go on 07/04/2022.   Specialty: Internal Medicine Why: At 1:45  PM Contact information: [address] [place] [address] [phone]                 Hospital Course by problem list: Mr. Steven Steven [name] is a 51 y/o person living with a history of HTN, COPD (no prior PFT's), severe bullous emphysema noted on imaging, and tobacco use disorder who presents with worsening shortness of breath for the last week that persisted the day prior to admission despite frequently using his albuterol inhaler prompting him to present to the ED.   #Acute COPD exacerbation #Severe bullous emphysema Patient presented with shortness of breath.  Patient was in acute COPD exacerbation.  Patient was treated with systemic steroids.  Patient initially required BiPAP, and eventually was transitioned to nasal cannula, and finally on room air.  Patient will be discharged on Incruse, advair, as well as prednisone taper for the next 2 weeks. Have reached out to pulmonology for outpatient follow-up.   #Hypertension Patient has a past medical history of hypertension.  Patient takes amlodipine 10 mg daily and losartan 25 mg daily.  Patient blood pressures were slightly elevated during hospitalization, and patient will be discharged on amlodipine 5 mg daily.  Consider restarting other home meds as necessary.  #Hypokalemia Patient potassium was a slightly low during hospitalization.  Patient's potassium was repleted.  #Leukocytosis Patient likely had leukocytosis with elevated white count of 16.1.  This was likely secondary to steroid use.   #Tobacco use disorder Patient encouraged to stop smoking during hospitalization.   Discharge Subjective:  Patient has no concerns today.  Patient reports he is doing well.  He denies any shortness of breath or chest pain.  Patient is ready go home.  Discharge Exam:   BP (!) 123/90 (BP Location: Right Arm)   Pulse 70   Temp 97.6 F (36.4 C) (Oral)   Resp 16   Ht 5\' 10"  (1.778 m)   Wt 72 kg   SpO2 95%   BMI 22.78 kg/m   Constitutional: Patient resting in bed upon my exam, no acute distress HENT: normocephalic atraumatic, mucous membranes moist Eyes: conjunctiva non-erythematous Neck: supple Cardiovascular: regular rate and rhythm, no m/r/g Pulmonary/Chest: normal work of breathing on room air, with inspiratory and expiratory wheezing noted to bilateral lung field  Abdominal: soft, non-tender, non-distended MSK: normal bulk and tone  Pertinent Labs, Studies, and Procedures:     Latest Ref Rng & Units 06/27/2022    1:27 AM 06/26/2022    3:28 AM 06/25/2022   11:41 AM  CBC  WBC 4.0 - 10.5 K/uL 16.1  19.4    Hemoglobin 13.0 - 17.0 g/dL 91.414.0  78.213.5  95.617.3   Hematocrit 39.0 - 52.0 % 40.9  40.1  51.0   Platelets 150 - 400 K/uL 268  274         Latest Ref Rng & Units 06/27/2022    1:27 AM 06/26/2022    3:28 AM 06/25/2022   11:41 AM  CMP  Glucose 70 - 99 mg/dL  85  184    BUN 6 - 20 mg/dL 6  12    Creatinine 8.41 - 1.24 mg/dL 3.24  4.01    Sodium 027 - 145 mmol/L 134  135  137   Potassium 3.5 - 5.1 mmol/L 4.1  3.2  3.8   Chloride 98 - 111 mmol/L 102  103    CO2 22 - 32 mmol/L 24  23    Calcium 8.9 - 10.3 mg/dL 8.6  8.4    Total Protein 6.5 - 8.1 g/dL  5.5    Total Bilirubin 0.3 - 1.2 mg/dL  0.3    Alkaline Phos 38 - 126 U/L  57    AST 15 - 41 U/L  15    ALT 0 - 44 U/L  19      DG Chest Port 1 View  Result Date: 06/25/2022 CLINICAL DATA:  Shortness of breath. EXAM: PORTABLE CHEST 1 VIEW COMPARISON:  06/15/2022 FINDINGS: Heart size and mediastinal contours are unremarkable. The lungs are hyperinflated. Severe changes of bullous emphysema with bilateral large upper lobe bulla measuring up to 15.4 cm. This is similar to the previous exam. No superimposed airspace consolidation, pleural effusion or interstitial edema. IMPRESSION: 1. Severe bullous emphysema. 2. No acute findings. Electronically Signed   By: Signa Kell M.D.   On: 06/25/2022 11:05     Discharge Instructions: Discharge  Instructions     Call MD for:  difficulty breathing, headache or visual disturbances   Complete by: As directed    Call MD for:  extreme fatigue   Complete by: As directed    Call MD for:  hives   Complete by: As directed    Call MD for:  persistant dizziness or light-headedness   Complete by: As directed    Call MD for:  persistant nausea and vomiting   Complete by: As directed    Call MD for:  redness, tenderness, or signs of infection (pain, swelling, redness, odor or green/yellow discharge around incision site)   Complete by: As directed    Call MD for:  severe uncontrolled pain   Complete by: As directed    Call MD for:  temperature >100.4   Complete by: As directed    Diet - low sodium heart healthy   Complete by: As directed    Increase activity slowly   Complete by: As directed       Steven Steven [name],  It was a pleasure taking care of you at [facility]. You were admitted for COPD exacerbation.  You are treated with steroids.  We are discharging you home now that you are doing better. Please follow the following instructions.   1) Regarding your medications, please continue taking Advair and Incruse at home.  These are 2 separate inhalers.  Please take 1 Steven [name] daily.  Also, continue taking your prednisone as prescribed.  We are going to taper it off for the next 2 weeks.  2) Regarding your follow-up appointments, you have an appointment with Dr. Champ Mungo on 07/04/2022 for hospital follow-up at the internal medicine center.  This appointment is at 1:45 PM.  Please show up.  3) We will arrange for you to get pulmonology follow-up.  Please await a phone call from the pulmonologist (lung doctor) for a follow-up.  4) Regarding you blood pressure medications, please take 5 mg amlodipine daily until you see Dr. August Saucer in the internal medicine center.  At that time they can consider titrating up on  your medication or not.  Take care,  Dr. Modena Slater, DO   Signed: Modena Slater, DO 06/27/2022, 2:58 PM   Pager: [phone]

## 2022-06-27 NOTE — Telephone Encounter (Signed)
[facility] appt scheduled for pt with Steven [name] 12/18 at 11:00. Attempted to call pt to let him know this had been done but line went directly to VM. Left pt a detailed message on machine of the appt info and also with our office address and phone number in case he needed to call to reschedule appt.  If pt is still in the hospital, the appt should show up on AVS once pt is discharged from the hospital. Nothing further needed.

## 2022-06-27 NOTE — Progress Notes (Signed)
NAME:  Steven [name], MRN:  [id number], DOB:  [birth year], LOS: 1 ADMISSION DATE:  06/25/2022, CONSULTATION DATE: 06/27/2022 REFERRING MD: Dr. Ninetta Lights, CHIEF COMPLAINT: COPD exacerbation  History of Present Illness:   This is a 51 year old gentleman, past medical history of hypertension, COPD, emphysema, tobacco use.Patient presented to the hospital with shortness of breath.  Has baseline severe bullous emphysema on imaging.  He had recent hospitalizations in June 2023 as well as a previous 1 on 06/15/2022 for COPD exacerbations.Patient has not establish care with an outpatient pulmonologist.  Pulmonary was consulted to see patient during this hospitalization.Currently being treated with 40 mg of prednisone, as needed albuterol all currently on therapy inhaler regimen with the Ellipta and Incruse Ellipta.   Pertinent  Medical History   Past Medical History:  Diagnosis Date   Asthma    COPD (chronic obstructive pulmonary disease) (HCC)    Hypertension      Significant Hospital Events: Including procedures, antibiotic start and stop dates in addition to other pertinent events     Interim History / Subjective:  Patient feels better today he says  Trying to quit smoking down to 1-2 cigarettes per day he says   Objective   Blood pressure 126/82, pulse 73, temperature 98.1 F (36.7 C), temperature source Oral, resp. rate 18, height 5\' 10"  (1.778 m), weight 72 kg, SpO2 98 %.        Intake/Output Summary (Last 24 hours) at 06/27/2022 1126 Last data filed at 06/27/2022 06/29/2022 Gross per 24 hour  Intake 480 ml  Output 1575 ml  Net -1095 ml   Filed Weights   06/25/22 1010  Weight: 72 kg    Examination: General: middle aged male, resting in bed, no distress, speaks in complete sentences HENT: NCAT tracking  Lungs: severely diminihsed breath sounds bilaterally  Cardiovascular: RRR< s1 s2  Abdomen: soft, nt nd  Extremities: no edema  Neuro: alert following commands  GU: deferred    Resolved Hospital Problem list     Assessment & Plan:   Acute exacerbation of COPD. Severe apical emphysema, denies THC use - no family history of lung disease or early emphysema  P:  Continue prednisone 40mg , can decrease by 10mg  every 3-4 days  Discharge on triple therapy inhaler  As an outpatient he needs a CT Chest with WO contrast to evaluate the bilateral apical predominate bullous disease He will need PFTs as an outpatient as well     Labs   CBC: Recent Labs  Lab 06/25/22 1017 06/25/22 1141 06/26/22 0328 06/27/22 0127  WBC 18.2*  --  19.4* 16.1*  NEUTROABS 13.3*  --   --  13.2*  HGB 16.7 17.3* 13.5 14.0  HCT 52.5* 51.0 40.1 40.9  MCV 88.8  --  84.4 83.5  PLT 340  --  274 268    Basic Metabolic Panel: Recent Labs  Lab 06/25/22 1017 06/25/22 1141 06/26/22 0328 06/27/22 0127  NA 138 137 135 134*  K 4.5 3.8 3.2* 4.1  CL 99  --  103 102  CO2 25  --  23 24  GLUCOSE 140*  --  184* 85  BUN 7  --  12 6  CREATININE 0.71  --  0.88 0.78  CALCIUM 9.0  --  8.4* 8.6*  MG  --   --  2.1  --    GFR: Estimated Creatinine Clearance: 111.3 mL/min (by C-G formula based on SCr of 0.78 mg/dL). Recent Labs  Lab 06/25/22 1017 06/26/22 0328 06/27/22 0127  WBC 18.2* 19.4* 16.1*    Liver Function Tests: Recent Labs  Lab 06/26/22 0328  AST 15  ALT 19  ALKPHOS 57  BILITOT 0.3  PROT 5.5*  ALBUMIN 2.8*   No results for input(s): "LIPASE", "AMYLASE" in the last 168 hours. No results for input(s): "AMMONIA" in the last 168 hours.  ABG    Component Value Date/Time   PHART 7.319 (L) 06/25/2022 1141   PCO2ART 46.7 06/25/2022 1141   PO2ART 108 06/25/2022 1141   HCO3 24.0 06/25/2022 1141   TCO2 25 06/25/2022 1141   ACIDBASEDEF 3.0 (H) 06/25/2022 1141   O2SAT 98 06/25/2022 1141     Coagulation Profile: No results for input(s): "INR", "PROTIME" in the last 168 hours.  Cardiac Enzymes: No results for input(s): "CKTOTAL", "CKMB", "CKMBINDEX", "TROPONINI" in the  last 168 hours.  HbA1C: Hgb A1c MFr Bld  Date/Time Value Ref Range Status  06/16/2022 12:28 AM 5.9 (H) 4.8 - 5.6 % Final    Comment:    (NOTE) Pre diabetes:          5.7%-6.4%  Diabetes:              >6.4%  Glycemic control for   <7.0% adults with diabetes     CBG: No results for input(s): "GLUCAP" in the last 168 hours.  Review of Systems:   Review of Systems  Constitutional:  Negative for chills, fever, malaise/fatigue and weight loss.  HENT:  Negative for hearing loss, sore throat and tinnitus.   Eyes:  Negative for blurred vision and double vision.  Respiratory:  Negative for cough, hemoptysis, sputum production, shortness of breath, wheezing and stridor.   Cardiovascular:  Negative for chest pain, palpitations, orthopnea, leg swelling and PND.  Gastrointestinal:  Negative for abdominal pain, constipation, diarrhea, heartburn, nausea and vomiting.  Genitourinary:  Negative for dysuria, hematuria and urgency.  Musculoskeletal:  Negative for joint pain and myalgias.  Skin:  Negative for itching and rash.  Neurological:  Negative for dizziness, tingling, weakness and headaches.  Endo/Heme/Allergies:  Negative for environmental allergies. Does not bruise/bleed easily.  Psychiatric/Behavioral:  Negative for depression. The patient is not nervous/anxious and does not have insomnia.   All other systems reviewed and are negative.    Past Medical History:  He,  has a past medical history of Asthma, COPD (chronic obstructive pulmonary disease) (HCC), and Hypertension.   Surgical History:  History reviewed. No pertinent surgical history.   Social History:   reports that he has been smoking cigarettes. He has been smoking an average of 1 pack per day. He has never used smokeless tobacco. He reports current alcohol use. He reports that he does not currently use drugs.   Family History:  His family history is not on file.   Allergies No Known Allergies   Home Medications   Prior to Admission medications   Medication Sig Start Date End Date Taking? Authorizing Provider  acetaminophen (TYLENOL) 500 MG tablet Take 2 tablets (1,000 mg total) by mouth every 6 (six) hours as needed for moderate pain or headache. 05/04/22  Yes Rocky Morel, DO  cetirizine (ZYRTEC) 10 MG tablet Take 1 tablet (10 mg total) by mouth daily. 05/04/22  Yes Rocky Morel, DO  umeclidinium bromide (INCRUSE ELLIPTA) 62.5 MCG/ACT AEPB Inhale 1 puff into the lungs daily. 06/16/22  Yes Champ Mungo, DO  albuterol (VENTOLIN HFA) 108 (90 Base) MCG/ACT inhaler Inhale 2 puffs into the lungs every 6 (six) hours as needed for wheezing or shortness of  breath. Patient not taking: Reported on 06/25/2022 06/16/22   Champ Mungo, DO  amLODipine (NORVASC) 10 MG tablet Take 1 tablet (10 mg total) by mouth daily. Patient not taking: Reported on 06/15/2022 12/30/21 06/15/22  Champ Mungo, DO  azithromycin [facility]) 250 MG tablet Take two tablets by mouth on Friday, 11/17, for one day. Patient not taking: Reported on 06/25/2022 06/17/22   Champ Mungo, DO  buPROPion (WELLBUTRIN XL) 150 MG 24 hr tablet Take 1 tablet (150 mg total) by mouth daily. Patient not taking: Reported on 06/15/2022 12/30/21 06/15/22  Champ Mungo, DO  fluticasone [facility]) 50 MCG/ACT nasal spray Place 1 spray into both nostrils daily. Patient not taking: Reported on 06/15/2022 05/04/22   Rocky Morel, DO  fluticasone-salmeterol (ADVAIR) 100-50 MCG/ACT AEPB Inhale 1 puff into the lungs 2 (two) times daily. Patient not taking: Reported on 06/25/2022 05/04/22   Rocky Morel, DO  ipratropium (ATROVENT) 0.02 % nebulizer solution USe 1 vial (0.5 mg total) by nebulization every 4 (four) hours as needed for wheezing or shortness of breath. Patient not taking: Reported on 06/25/2022 12/30/21   Champ Mungo, DO  ipratropium (ATROVENT) 0.02 % nebulizer solution Take 2.5 mLs (0.5 mg total) by nebulization 2 (two) times daily and every 4 hours as  needed Patient not taking: Reported on 06/25/2022 05/04/22   Rocky Morel, DO  losartan (COZAAR) 25 MG tablet Take 1 tablet (25 mg total) by mouth daily. Patient not taking: Reported on 06/15/2022 05/26/22 06/25/22  Reymundo Poll, MD  predniSONE (DELTASONE) 20 MG tablet Take 2 tablets (40 mg total) by mouth daily with breakfast. Patient not taking: Reported on 06/25/2022 06/17/22   Champ Mungo, DO  varenicline (CHANTIX) 0.5 MG tablet Take 1 tab once daily for days 1-3. Take 1 tab twice daily for days 4-7. Take 2 tabs twice daily every day starting day 8. Patient not taking: Reported on 06/15/2022 12/30/21   Champ Mungo, DO  varenicline (CHANTIX) 0.5 MG tablet Take 1 tablet (0.5 mg total) by mouth 2 (two) times daily. Patient not taking: Reported on 06/15/2022 05/04/22   Rocky Morel, DO     Josephine Igo, DO [facility] 06/27/2022 3:06 PM

## 2022-06-27 NOTE — Discharge Instructions (Addendum)
Mr. Kahlin Mark,  It was a pleasure taking care of you at [facility]. You were admitted for COPD exacerbation.  You are treated with steroids.  We are discharging you home now that you are doing better. Please follow the following instructions.   1) Regarding your medications, please continue taking Advair and Incruse at home.  These are 2 separate inhalers.  Please take 1 puff daily.  Also, continue taking your prednisone as prescribed.  We are going to taper it off for the next 2 weeks.  2) Regarding your follow-up appointments, you have an appointment with Dr. Champ Mungo on 07/04/2022 for hospital follow-up at the internal medicine center.  This appointment is at 1:45 PM.  Please show up.  3) We will arrange for you to get pulmonology follow-up.  Please await a phone call from the pulmonologist (lung doctor) for a follow-up.  4) Regarding you blood pressure medications, please take 5 mg amlodipine daily until you see Dr. August Saucer in the internal medicine center.  At that time they can consider titrating up on your medication or not.  Take care,  Dr. Modena Slater, DO

## 2022-06-27 NOTE — Progress Notes (Signed)
Nsg Discharge Note  Admit Date:  06/25/2022 Discharge date: 06/27/2022   Steven [name] to be D/C'd Home per MD order.  AVS completed. Patient/caregiver able to verbalize understanding.  Discharge Medication: Allergies as of 06/27/2022   No Known Allergies      Medication List     STOP taking these medications    azithromycin 250 MG tablet Commonly known as: ZITHROMAX   ipratropium 0.02 % nebulizer solution Commonly known as: ATROVENT   losartan 25 MG tablet Commonly known as: COZAAR       TAKE these medications    acetaminophen 500 MG tablet Commonly known as: TYLENOL Take 2 tablets (1,000 mg total) by mouth every 6 (six) hours as needed for moderate pain or headache.   albuterol 108 (90 Base) MCG/ACT inhaler Commonly known as: VENTOLIN HFA Inhale 2 puffs into the lungs every 6 (six) hours as needed for wheezing or shortness of breath.   amLODipine 5 MG tablet Commonly known as: NORVASC Take 1 tablet (5 mg total) by mouth daily. What changed:  medication strength how much to take   buPROPion 150 MG 24 hr tablet Commonly known as: WELLBUTRIN XL Take 1 tablet (150 mg total) by mouth daily.   cetirizine 10 MG tablet Commonly known as: ZYRTEC Take 1 tablet (10 mg total) by mouth daily.   fluticasone 50 MCG/ACT nasal spray Commonly known as: FLONASE Place 1 spray into both nostrils daily.   fluticasone-salmeterol 100-50 MCG/ACT Aepb Commonly known as: ADVAIR Inhale 1 puff into the lungs 2 (two) times daily.   predniSONE 10 MG tablet Commonly known as: DELTASONE Take 4 tablets (40 mg total) by mouth daily with breakfast for 3 days, THEN 3 tablets (30 mg total) daily with breakfast for 3 days, THEN 2 tablets (20 mg total) daily with breakfast for 3 days, THEN 1 tablet (10 mg total) daily with breakfast for 3 days, THEN 0.5 tablets (5 mg total) daily with breakfast for 3 days. Start taking on: June 28, 2022 What changed:  medication strength See the  new instructions.   umeclidinium bromide 62.5 MCG/ACT Aepb Commonly known as: INCRUSE ELLIPTA Inhale 1 puff into the lungs daily.   varenicline 0.5 MG tablet Commonly known as: CHANTIX Take 1 tab once daily for days 1-3. Take 1 tab twice daily for days 4-7. Take 2 tabs twice daily every day starting day 8. What changed: Another medication with the same name was removed. Continue taking this medication, and follow the directions you see here.        Discharge Assessment: Vitals:   06/27/22 0815 06/27/22 1200  BP:  (!) 123/90  Pulse:  70  Resp:  16  Temp:  97.6 F (36.4 C)  SpO2: 98% 95%   Skin clean, dry and intact without evidence of skin break down, no evidence of skin tears noted. IV catheter discontinued intact. Site without signs and symptoms of complications - no redness or edema noted at insertion site, patient denies c/o pain - only slight tenderness at site.  Dressing with slight pressure applied.  D/c Instructions-Education: Discharge instructions given to patient/family with verbalized understanding. D/c education completed with patient/family including follow up instructions, medication list, d/c activities limitations if indicated, with other d/c instructions as indicated by MD - patient able to verbalize understanding, all questions fully answered. Patient instructed to return to ED, call 911, or call MD for any changes in condition.  Patient escorted via WC, and D/C home via private auto.  Trudee Chirino,  RN 06/27/2022 3:58 PM

## 2022-06-27 NOTE — Telephone Encounter (Signed)
PCCM:  Next available provider for a hospital follow up would be great  Thanks  BLI   Josephine Igo, DO [facility] 06/27/2022 3:08 PM

## 2022-06-27 NOTE — Progress Notes (Signed)
Mobility Specialist Progress Note:   06/27/22 1050  Mobility  Activity Ambulated with assistance in hallway  Level of Assistance Independent  Assistive Device None  Distance Ambulated (ft) 450 ft  Activity Response Tolerated well  $Mobility charge 1 Mobility   During Mobility:93% SpO2 Post Mobility: 96%SpO2  Pt received in bed willing to participate in mobility. No complaints of pain. Left EOB with call bell in reach and all needs met.   Gareth Eagle Lucero Ide Mobility Specialist Please contact via [employer] or  Rehab Office at [phone]

## 2022-06-28 ENCOUNTER — Other Ambulatory Visit (HOSPITAL_COMMUNITY): Payer: Self-pay

## 2022-06-28 ENCOUNTER — Other Ambulatory Visit: Payer: Self-pay | Admitting: Internal Medicine

## 2022-06-28 ENCOUNTER — Telehealth: Payer: Self-pay

## 2022-06-28 ENCOUNTER — Telehealth: Payer: Self-pay | Admitting: Student

## 2022-06-28 ENCOUNTER — Other Ambulatory Visit: Payer: Self-pay | Admitting: Student

## 2022-06-28 MED ORDER — BUPROPION HCL ER (XL) 150 MG PO TB24
150.0000 mg | ORAL_TABLET | Freq: Every day | ORAL | 2 refills | Status: AC
  Filled 2022-06-28: qty 30, 30d supply, fill #0
  Filled 2022-07-27: qty 30, 30d supply, fill #1

## 2022-06-28 NOTE — Telephone Encounter (Signed)
TOC HFU  Name: Steven [name], Steven [name] MRN: [id number]  Date: 07/04/2022 Status: Sch  Time: 1:45 PM Length: 30  Visit Type: OPEN ESTABLISHED [726] Copay: $0.00  Provider: Champ Mungo, DO

## 2022-06-28 NOTE — Telephone Encounter (Signed)
Pt states he did not receive the following medication upon is D/C, and wants to know which of the following pharmacies he should pick up his medication from.   buPROPion (WELLBUTRIN XL) 150 MG 24 hr tablet (Expired)  [facility]   [facility] (Ph: [phone]

## 2022-06-28 NOTE — Telephone Encounter (Signed)
Transition Care Management Follow-up Telephone Call Date of discharge and from where:   TCM DC [facility] 06-27-22 TF:[facility]    How have you been since you were released from the hospital? Doing good  Any questions or concerns? Yes  Items Reviewed: Did the pt receive and understand the discharge instructions provided? Yes  Medications obtained and verified? Yes  Other? No  Any new allergies since your discharge? No  Dietary orders reviewed? Yes Do you have support at home? Yes   Home Care and Equipment/Supplies: Were home health services ordered? no If so, what is the name of the agency? na  Has the agency set up a time to come to the patient's home? not applicable Were any new equipment or medical supplies ordered?  No What is the name of the medical supply agency? na Were you able to get the supplies/equipment? not applicable Do you have any questions related to the use of the equipment or supplies? No  Functional Questionnaire: (I = Independent and D = Dependent) ADLs: I  Bathing/Dressing- I  Meal Prep- I  Eating- I  Maintaining continence- I  Transferring/Ambulation- I  Managing Meds- I  Follow up appointments reviewed:  PCP Hospital f/u appt confirmed? Yes  Scheduled to see Champ Mungo DO on 07-04-22 @ 145pm. Specialist [facility] f/u appt confirmed? Yes  Scheduled to see Dr Allison Quarry on 07-18-22 @ 11am. Are transportation arrangements needed? No  If their condition worsens, is the pt aware to call PCP or go to the Emergency Dept.? Yes Was the patient provided with contact information for the PCP's office or ED? Yes Was to pt encouraged to call back with questions or concerns? Yes   Woodfin Ganja LPN [facility] Nurse Health Advisor Direct Dial [phone]

## 2022-06-28 NOTE — Telephone Encounter (Signed)
Pt stated he was discharged yesterday from the hospital and needs a refill on Wellbutrin.

## 2022-06-30 ENCOUNTER — Other Ambulatory Visit (HOSPITAL_COMMUNITY): Payer: Self-pay

## 2022-07-04 ENCOUNTER — Encounter: Payer: Commercial Managed Care - HMO | Admitting: Internal Medicine

## 2022-07-04 ENCOUNTER — Encounter: Payer: Self-pay | Admitting: Student

## 2022-07-04 NOTE — Progress Notes (Deleted)
HTN: BP at goal, *. Current regimen is amlodipine 5 mg daily.  COPD: Current regimen is albuterol inhaler, advair, incruse ellipta.

## 2022-07-18 ENCOUNTER — Inpatient Hospital Stay: Payer: Commercial Managed Care - HMO | Admitting: Nurse Practitioner

## 2022-07-20 ENCOUNTER — Other Ambulatory Visit (HOSPITAL_COMMUNITY): Payer: Self-pay

## 2022-07-27 ENCOUNTER — Other Ambulatory Visit: Payer: Self-pay | Admitting: Internal Medicine

## 2022-07-27 ENCOUNTER — Other Ambulatory Visit (HOSPITAL_COMMUNITY): Payer: Self-pay

## 2022-07-27 DIAGNOSIS — J441 Chronic obstructive pulmonary disease with (acute) exacerbation: Secondary | ICD-10-CM

## 2022-07-27 NOTE — Telephone Encounter (Signed)
Lasts visit 01/06/22 with instructions to return in 4 wks  No showed multiple appts  Recent hospitalization one mth ago Next appt with [facility] scheduled for  08/02/2022

## 2022-07-28 ENCOUNTER — Other Ambulatory Visit (HOSPITAL_COMMUNITY): Payer: Self-pay

## 2022-07-28 ENCOUNTER — Other Ambulatory Visit: Payer: Self-pay

## 2022-07-28 ENCOUNTER — Telehealth: Payer: Self-pay

## 2022-07-28 MED ORDER — FLUTICASONE-SALMETEROL 100-50 MCG/ACT IN AEPB
1.0000 | INHALATION_SPRAY | Freq: Two times a day (BID) | RESPIRATORY_TRACT | 0 refills | Status: AC
  Filled 2022-07-28: qty 60, 30d supply, fill #0

## 2022-07-28 MED ORDER — AMLODIPINE BESYLATE 5 MG PO TABS
5.0000 mg | ORAL_TABLET | Freq: Every day | ORAL | 0 refills | Status: AC
  Filled 2022-07-28: qty 30, 30d supply, fill #0

## 2022-07-28 NOTE — Telephone Encounter (Signed)
Requesting to speak with a nurse about getting refill on Prednisone. Please call pt back.

## 2022-07-28 NOTE — Telephone Encounter (Signed)
Will refill one month prescription to get him to next appointment. After that can send with refills if meds still warranted.

## 2022-07-28 NOTE — Telephone Encounter (Signed)
Recommend he be seen in person, because we are closed would advise he go to an urgent care or emergency department if he feels he is having a COPD exacerbation

## 2022-07-28 NOTE — Telephone Encounter (Signed)
Called patient, no answer. Voicemail with identification and left message for Steven [name] recommending if he is symptomatic to present to urgent care or emergency department as our clinic will be closed for the holidays tomorrow and Monday.

## 2022-07-28 NOTE — Telephone Encounter (Signed)
Want refill Prednisone.  Is trying to prevent a flareup.  Wants it sent to [facility] in [place].

## 2022-08-02 ENCOUNTER — Telehealth: Payer: Self-pay

## 2022-08-02 NOTE — Telephone Encounter (Signed)
PREDNISONE

## 2022-08-04 ENCOUNTER — Telehealth: Payer: Self-pay | Admitting: *Deleted

## 2022-08-04 ENCOUNTER — Encounter: Payer: Commercial Managed Care - HMO | Admitting: Internal Medicine

## 2022-08-04 NOTE — Telephone Encounter (Signed)
LVM for patient to call the main number to the clinic if wanting to reschedule his missed appointment for 08-04-2022. He is to call [phone].

## 2022-08-04 NOTE — Progress Notes (Deleted)
HTN: BP at goal, *. Current regimen is amlodipine 5 mg daily. Plan:  COPD, BULLOUS EMPHYSEMA: Patient has not been evaluated by pulmonology or our clinic since hospitalization in November for COPD exacerbation. He was seen at the ED at a [facility] facility for dyspnea and diagnosed with an additional COPD exacerbation. He was discharged with albuterol nebulizer q4h PRN wheezing, advair 1 puff BID, and prednisone taper which he is on day * of 10 of (should be day 4). He has had financial barriers in the past to obtaining the therapies that he needs. Assessment: Patient has had three hospital admissions/visits since 06/2022 for COPD exacerbations. His current therapy consists of SABA, LABA, and ICS. He was discharged on a LAMA, umeclidinium, after his last hospital admission 06/25/2022 however I do not see this on his medication list. He currently reports that he is taking *. He has pulmonology follow-up scheduled for tomorrow 08/05/2021. Given his numerous exacerbations and symptom burden, I recommend LAMA, LABA, and ICS therapy for Steven [name]. Plan:

## 2022-08-05 ENCOUNTER — Inpatient Hospital Stay: Payer: Commercial Managed Care - HMO | Admitting: Nurse Practitioner

## 2022-08-19 ENCOUNTER — Other Ambulatory Visit (HOSPITAL_COMMUNITY): Payer: Self-pay

## 2022-08-25 ENCOUNTER — Telehealth: Payer: Self-pay

## 2022-08-25 NOTE — Telephone Encounter (Signed)
Transition Care Management Unsuccessful Follow-up Telephone Call  Date of discharge and from where:  [facility] 08/24/2022  Attempts:  1st Attempt  Reason for unsuccessful TCM follow-up call:  Left voice message Juanda Crumble, [place] Direct Dial [phone]

## 2022-08-26 NOTE — Telephone Encounter (Signed)
Transition Care Management Unsuccessful Follow-up Telephone Call  Date of discharge and from where:  [facility] 08/24/2022  Attempts:  2nd Attempt  Reason for unsuccessful TCM follow-up call:  Unable to reach patient Steven [name], [place] Direct Dial [phone]

## 2022-08-30 NOTE — Telephone Encounter (Signed)
Transition Care Management Unsuccessful Follow-up Telephone Call  Date of discharge and from where:  [facility] 08/24/2022  Attempts:  3rd Attempt  Reason for unsuccessful TCM follow-up call:  Left voice message Juanda Crumble, [place] Direct Dial [phone]

## 2022-09-13 ENCOUNTER — Telehealth: Payer: Self-pay

## 2022-09-13 NOTE — Transitions of Care (Post Inpatient/ED Visit) (Signed)
   09/13/2022  Name: Steven [name] MRN: [id number] DOB: 1971-01-16  Today's TOC FU Call Status: Today's TOC FU Call Status:: Successful TOC FU Call Competed TOC FU Call Complete Date: 09/13/22  Transition Care Management Follow-up Telephone Call Date of Discharge: 09/12/22 Discharge Facility: Other Facility Name of Other Discharge Facility: [facility] Type of Discharge: Inpatient Admission Primary Inpatient Discharge Diagnosis:: respiratory failure How have you been since you were released from the hospital?: Better Any questions or concerns?: No  Items Reviewed: Did you receive and understand the discharge instructions provided?: Yes Medications obtained and verified?: Yes (Medications Reviewed) Any new allergies since your discharge?: No Dietary orders reviewed?: NA Do you have support at home?: Yes People in Home: sibling(s)  Home Care and Equipment/Supplies: [place] Ordered?: NA Any new equipment or medical supplies ordered?: NA  Functional Questionnaire: Do you need assistance with bathing/showering or dressing?: No Do you need assistance with meal preparation?: No Do you need assistance with eating?: No Do you have difficulty maintaining continence: No Do you need assistance with getting out of bed/getting out of a chair/moving?: No Do you have difficulty managing or taking your medications?: No  Folllow up appointments reviewed: PCP Follow-up appointment confirmed?: No MD Provider Line Number:[phone] Given: No Specialist Hospital Follow-up appointment confirmed?: Yes Date of Specialist follow-up appointment?: 10/03/22 Follow-Up Specialty Provider:: pulmonary Do you need transportation to your follow-up appointment?: No Do you understand care options if your condition(s) worsen?: Yes-patient verbalized understanding    SIGNATURE Juanda Crumble, [place] Nurse Health Advisor Direct Dial [phone]

## 2022-09-26 ENCOUNTER — Telehealth: Payer: Self-pay

## 2022-09-26 NOTE — Transitions of Care (Post Inpatient/ED Visit) (Signed)
   09/26/2022  Name: Steven [name] MRN: [id number] DOB: 1970/11/24  Today's TOC FU Call Status: Today's TOC FU Call Status:: Successful TOC FU Call Competed TOC FU Call Complete Date: 09/26/22  Transition Care Management Follow-up Telephone Call Date of Discharge: 09/24/22 Discharge Facility: Other ([place]) Name of Other ([facility]) Discharge Facility: [facility] Type of Discharge: Inpatient Admission Primary Inpatient Discharge Diagnosis:: acute respiratory failure How have you been since you were released from the hospital?: Better Any questions or concerns?: No  Items Reviewed: Did you receive and understand the discharge instructions provided?: Yes Medications obtained and verified?: Yes (Medications Reviewed) Any new allergies since your discharge?: No Dietary orders reviewed?: NA People in Home: spouse  Home Care and Equipment/Supplies: [place] Ordered?: NA Any new equipment or medical supplies ordered?: NA  Functional Questionnaire: Do you need assistance with bathing/showering or dressing?: No Do you need assistance with meal preparation?: No Do you need assistance with eating?: No Do you have difficulty maintaining continence: No Do you need assistance with getting out of bed/getting out of a chair/moving?: No Do you have difficulty managing or taking your medications?: No  Folllow up appointments reviewed: PCP Follow-up appointment confirmed?: NA MD Provider Line Number:[phone] Given: No Specialist Hospital Follow-up appointment confirmed?: Yes Date of Specialist follow-up appointment?: 09/28/22 Follow-Up Specialty Provider:: [place] [facility] Do you need transportation to your follow-up appointment?: No Do you understand care options if your condition(s) worsen?: Yes-patient verbalized understanding Patient has transferred to [facility] PCP, he is living there now.   [place], [place] Nurse Health  Advisor Direct Dial [phone]

## 2022-10-07 ENCOUNTER — Telehealth: Payer: Self-pay

## 2022-10-07 NOTE — Transitions of Care (Post Inpatient/ED Visit) (Unsigned)
   10/07/2022  Name: Steven [name] MRN: [id number] DOB: 24-Apr-1971  Today's TOC FU Call Status: Today's TOC FU Call Status:: Unsuccessul Call (1st Attempt) Unsuccessful Call (1st Attempt) Date: 10/07/22  Attempted to reach the patient regarding the most recent Inpatient/ED visit.  Follow Up Plan: Additional outreach attempts will be made to reach the patient to complete the Transitions of Care (Post Inpatient/ED visit) call.   Signature Juanda Crumble, [place] Direct Dial [phone]

## 2022-10-10 NOTE — Transitions of Care (Post Inpatient/ED Visit) (Signed)
   10/10/2022  Name: Steven [name] MRN: [id number] DOB: [birth year]  Today's TOC FU Call Status: Today's TOC FU Call Status:: Successful TOC FU Call Competed Unsuccessful Call (1st Attempt) Date: 10/07/22 [facility] FU Call Complete Date: 10/10/22  Transition Care Management Follow-up Telephone Call Date of Discharge: 10/06/22 Discharge Facility: Other ([place]) Name of Other ([facility]) Discharge Facility: [facility] Type of Discharge: Inpatient Admission Primary Inpatient Discharge Diagnosis:: COPD How have you been since you were released from the hospital?: Better Any questions or concerns?: No  Items Reviewed: Did you receive and understand the discharge instructions provided?: Yes Medications obtained and verified?: Yes (Medications Reviewed) Any new allergies since your discharge?: No Dietary orders reviewed?: Yes Do you have support at home?: Yes People in Home: spouse  Home Care and Equipment/Supplies: [place] Ordered?: NA Any new equipment or medical supplies ordered?: NA  Functional Questionnaire: Do you need assistance with bathing/showering or dressing?: No Do you need assistance with meal preparation?: No Do you need assistance with eating?: No Do you have difficulty maintaining continence: No Do you need assistance with getting out of bed/getting out of a chair/moving?: No Do you have difficulty managing or taking your medications?: No  Folllow up appointments reviewed: PCP Follow-up appointment confirmed?: No (patient has transferred to [facility]) MD Provider Line Number:[phone] Given: No [facility] Follow-up appointment confirmed?: NA Do you need transportation to your follow-up appointment?: No Do you understand care options if your condition(s) worsen?: Yes-patient verbalized understanding    [place], [place] Nurse Health Advisor Direct Dial [phone]

## 2022-11-07 ENCOUNTER — Telehealth: Payer: Self-pay

## 2022-11-07 NOTE — Transitions of Care (Post Inpatient/ED Visit) (Unsigned)
   11/07/2022  Name: Steven [name] MRN: [id number] DOB: [birth year]  Today's TOC FU Call Status: Today's TOC FU Call Status:: Unsuccessul Call (1st Attempt) Unsuccessful Call (1st Attempt) Date: 11/07/22  Attempted to reach the patient regarding the most recent Inpatient/ED visit.  Follow Up Plan: Additional outreach attempts will be made to reach the patient to complete the Transitions of Care (Post Inpatient/ED visit) call.   Signature Karena Addison, LPN [facility] Nurse Health Advisor Direct Dial [phone]

## 2022-11-08 NOTE — Transitions of Care (Post Inpatient/ED Visit) (Signed)
   11/08/2022  Name: Steven [name] MRN: [id number] DOB: 1971/01/27  Today's TOC FU Call Status: Today's TOC FU Call Status:: Unsuccessful Call (2nd Attempt) Unsuccessful Call (1st Attempt) Date: 11/07/22 Unsuccessful Call (2nd Attempt) Date: 11/08/22  Attempted to reach the patient regarding the most recent Inpatient/ED visit.  Follow Up Plan: No further outreach attempts will be made at this time. We have been unable to contact the patient.  Signature Karena Addison, LPN [facility] Nurse Health Advisor Direct Dial [phone]

## 2022-12-08 ENCOUNTER — Telehealth: Payer: Self-pay | Admitting: *Deleted

## 2022-12-08 NOTE — Transitions of Care (Post Inpatient/ED Visit) (Signed)
   12/08/2022  Name: Steven [name] MRN: [id number] DOB: Sep 20, 1970  Today's TOC FU Call Status: Today's TOC FU Call Status:: Unsuccessul Call (1st Attempt) Unsuccessful Call (2nd Attempt) Date: 12/08/22  Attempted to reach the patient regarding the most recent Inpatient/ED visit.  Follow Up Plan: No further outreach attempts will be made at this time. We have been unable to contact the patient.  Pt is currently at the [facility] ED per chart. Signature Netra Postlethwait,RN

## 2022-12-23 ENCOUNTER — Telehealth: Payer: Self-pay

## 2022-12-23 NOTE — Transitions of Care (Post Inpatient/ED Visit) (Unsigned)
   12/23/2022  Name: Steven [name] MRN: [id number] DOB: 05-06-1971  Today's TOC FU Call Status: Today's TOC FU Call Status:: Unsuccessul Call (1st Attempt) Unsuccessful Call (1st Attempt) Date: 12/23/22  Attempted to reach the patient regarding the most recent Inpatient/ED visit.  Follow Up Plan: Additional outreach attempts will be made to reach the patient to complete the Transitions of Care (Post Inpatient/ED visit) call.   Signature  Karena Addison, LPN [facility] Nurse Health Advisor Direct Dial [phone]

## 2022-12-27 NOTE — Transitions of Care (Post Inpatient/ED Visit) (Unsigned)
   12/27/2022  Name: Steven [name] MRN: [id number] DOB: 05-Jan-1971  Today's TOC FU Call Status: Today's TOC FU Call Status:: Unsuccessful Call (2nd Attempt) Unsuccessful Call (1st Attempt) Date: 12/23/22 Unsuccessful Call (2nd Attempt) Date: 12/27/22  Attempted to reach the patient regarding the most recent Inpatient/ED visit.  Follow Up Plan: Additional outreach attempts will be made to reach the patient to complete the Transitions of Care (Post Inpatient/ED visit) call.   Signature Karena Addison, LPN [facility] Nurse Health Advisor Direct Dial [phone]

## 2022-12-28 NOTE — Transitions of Care (Post Inpatient/ED Visit) (Signed)
   12/28/2022  Name: Steven [name] MRN: [id number] DOB: 1970/09/28  Today's TOC FU Call Status: Today's TOC FU Call Status:: Unsuccessful Call (3rd Attempt) Unsuccessful Call (1st Attempt) Date: 12/23/22 Unsuccessful Call (2nd Attempt) Date: 12/27/22 Unsuccessful Call (3rd Attempt) Date: 12/28/22  Attempted to reach the patient regarding the most recent Inpatient/ED visit.  Follow Up Plan: No further outreach attempts will be made at this time. We have been unable to contact the patient.  Signature Karena Addison, LPN [facility] Nurse Health Advisor Direct Dial [phone]
